# Patient Record
Sex: Female | Born: 1940 | Race: Black or African American | Hispanic: No | Marital: Single | Smoking: Former smoker
Health system: Southern US, Community
[De-identification: ages and names within clinical notes are randomized; demographics above are authoritative.]

## PROBLEM LIST (undated history)

## (undated) DIAGNOSIS — J189 Pneumonia, unspecified organism: Secondary | ICD-10-CM

## (undated) DIAGNOSIS — K259 Gastric ulcer, unspecified as acute or chronic, without hemorrhage or perforation: Secondary | ICD-10-CM

## (undated) DIAGNOSIS — C439 Malignant melanoma of skin, unspecified: Secondary | ICD-10-CM

## (undated) DIAGNOSIS — M5136 Other intervertebral disc degeneration, lumbar region: Secondary | ICD-10-CM

## (undated) DIAGNOSIS — E785 Hyperlipidemia, unspecified: Secondary | ICD-10-CM

## (undated) DIAGNOSIS — M1990 Unspecified osteoarthritis, unspecified site: Secondary | ICD-10-CM

## (undated) DIAGNOSIS — K219 Gastro-esophageal reflux disease without esophagitis: Secondary | ICD-10-CM

## (undated) DIAGNOSIS — M51369 Other intervertebral disc degeneration, lumbar region without mention of lumbar back pain or lower extremity pain: Secondary | ICD-10-CM

## (undated) HISTORY — PX: ABDOMINAL HYSTERECTOMY: SHX81

## (undated) HISTORY — PX: CHOLECYSTECTOMY: SHX55

## (undated) HISTORY — PX: TONSILLECTOMY: SUR1361

---

## 2009-09-03 LAB — CONVERTED CEMR LAB: Pap Smear: NORMAL

## 2009-10-25 ENCOUNTER — Ambulatory Visit (HOSPITAL_BASED_OUTPATIENT_CLINIC_OR_DEPARTMENT_OTHER): Admission: RE | Admit: 2009-10-25 | Discharge: 2009-10-25 | Payer: Self-pay | Admitting: Internal Medicine

## 2009-10-25 ENCOUNTER — Ambulatory Visit: Payer: Self-pay | Admitting: Family Medicine

## 2009-10-25 ENCOUNTER — Ambulatory Visit (HOSPITAL_BASED_OUTPATIENT_CLINIC_OR_DEPARTMENT_OTHER): Admission: RE | Admit: 2009-10-25 | Discharge: 2009-10-25 | Payer: Self-pay | Admitting: Family Medicine

## 2009-10-25 ENCOUNTER — Ambulatory Visit: Payer: Self-pay | Admitting: Internal Medicine

## 2009-10-25 ENCOUNTER — Ambulatory Visit: Payer: Self-pay | Admitting: Interventional Radiology

## 2009-10-25 DIAGNOSIS — K219 Gastro-esophageal reflux disease without esophagitis: Secondary | ICD-10-CM

## 2009-10-25 DIAGNOSIS — Z860100 Personal history of colon polyps, unspecified: Secondary | ICD-10-CM | POA: Insufficient documentation

## 2009-10-25 DIAGNOSIS — R1319 Other dysphagia: Secondary | ICD-10-CM

## 2009-10-25 DIAGNOSIS — E785 Hyperlipidemia, unspecified: Secondary | ICD-10-CM

## 2009-10-25 DIAGNOSIS — M1990 Unspecified osteoarthritis, unspecified site: Secondary | ICD-10-CM

## 2009-10-25 DIAGNOSIS — J189 Pneumonia, unspecified organism: Secondary | ICD-10-CM

## 2009-10-25 DIAGNOSIS — M25569 Pain in unspecified knee: Secondary | ICD-10-CM

## 2009-10-25 DIAGNOSIS — Z8541 Personal history of malignant neoplasm of cervix uteri: Secondary | ICD-10-CM

## 2009-10-26 ENCOUNTER — Encounter: Payer: Self-pay | Admitting: Family Medicine

## 2009-10-26 ENCOUNTER — Telehealth: Payer: Self-pay | Admitting: Internal Medicine

## 2009-11-01 ENCOUNTER — Encounter: Payer: Self-pay | Admitting: Internal Medicine

## 2009-11-01 ENCOUNTER — Ambulatory Visit: Payer: Self-pay | Admitting: Internal Medicine

## 2009-11-01 LAB — CONVERTED CEMR LAB
ALT: 20 units/L (ref 0–35)
AST: 19 units/L (ref 0–37)
Basophils Relative: 0.8 % (ref 0.0–3.0)
Bilirubin, Direct: 0.1 mg/dL (ref 0.0–0.3)
CO2: 29 meq/L (ref 19–32)
Chloride: 101 meq/L (ref 96–112)
Eosinophils Relative: 2.8 % (ref 0.0–5.0)
HCT: 43 % (ref 36.0–46.0)
Hemoglobin: 14.9 g/dL (ref 12.0–15.0)
Lymphs Abs: 1.4 10*3/uL (ref 0.7–4.0)
Monocytes Relative: 7.4 % (ref 3.0–12.0)
Neutro Abs: 3.5 10*3/uL (ref 1.4–7.7)
Platelets: 192 10*3/uL (ref 150.0–400.0)
Potassium: 4.9 meq/L (ref 3.5–5.1)
RBC: 4.64 M/uL (ref 3.87–5.11)
TSH: 1 microintl units/mL (ref 0.35–5.50)
Total Bilirubin: 0.7 mg/dL (ref 0.3–1.2)
Total CHOL/HDL Ratio: 3
WBC: 5.4 10*3/uL (ref 4.5–10.5)

## 2009-11-15 ENCOUNTER — Encounter: Payer: Self-pay | Admitting: Internal Medicine

## 2009-12-20 ENCOUNTER — Ambulatory Visit: Payer: Self-pay | Admitting: Internal Medicine

## 2009-12-20 DIAGNOSIS — I10 Essential (primary) hypertension: Secondary | ICD-10-CM | POA: Insufficient documentation

## 2010-02-19 NOTE — Letter (Signed)
   [place] [facility] [address] [place], [place]  [address]  [place] Phone: [phone]      November 01, 2009   Artesia Batzel [address] [place], [place] [address]  RE:  LAB RESULTS  Dear  Ms. Batton,  The following is an interpretation of your most recent lab tests.  Please take note of any instructions provided or changes to medications that have resulted from your lab work.  ELECTROLYTES:  Good - no changes needed  KIDNEY FUNCTION TESTS:  Good - no changes needed  LIVER FUNCTION TESTS:  Good - no changes needed  LIPID PANEL:  Fair - review at your next visit Triglyceride: 116.0   Cholesterol: 230   HDL: 80.30   Chol/HDL%:  3  THYROID STUDIES:  Thyroid studies normal TSH: 1.00     CBC:  Good - no changes needed  CRP - normal       Sincerely Yours,    Dr. Thomos Lemons  Appended Document:  mailed

## 2010-02-19 NOTE — Assessment & Plan Note (Signed)
Summary: new pt est care/dt [employer]   Vital Signs:  Patient profile:   70 year old female Menstrual status:  hysterectomy Height:      61.75 inches Weight:      147 pounds BMI:     27.20 O2 Sat:      97 % on Room air Temp:     98.2 degrees F oral Pulse rate:   76 / minute Pulse rhythm:   regular Resp:     16 per minute BP sitting:   130 / 80  (right arm) Cuff size:   regular  Vitals Entered By: Glendell Docker CMA (October 25, 2009 2:33 PM)  O2 Flow:  Room air CC: New Patient  Is Patient Diabetic? No Pain Assessment Patient in pain? no      Comments Establish care-previous pcp Dr Juliette Mangle longer accepts insurance     Menstrual Status hysterectomy Last PAP Result normal   Contraindications/Deferment of Procedures/Staging:    Test/Procedure: FLU VAX    Reason for deferment: patient declined   Primary Care Provider:  Dondra Spry DO  CC:  New Patient .  History of Present Illness: 70 y/o female to establish currently undergoing evaluation for left knee pain onset last saturday pt was taking OTC pain medications - aleve taken aleve for 1-2 yrs  x ray of knee reports normal  she also takes aleve for chronic back pain uses 3-4 x per day  (2 aleve) no stomach pain.    several yrs since CPX  Preventive Screening-Counseling & Management  Alcohol-Tobacco     Alcohol drinks/day: <1     Smoking Status: quit     Packs/Day: 1.0     Year Started: 1960's     Year Quit: 2000  Caffeine-Diet-Exercise     Caffeine use/day: 2 beverages daily     Does Patient Exercise: no  Allergies (verified): 1)  ! Sulfa  Past History:  Past Medical History: Cervical cancer, hx of Colonic polyps, hx of GERD  - hx of PUD Mild hyperlipidemia - tx'ed but stopped on her own vision loss in right eye  Past Surgical History: Cholecystectomy 1970 Partial Hysterectomy 1980 Tonsillectomy 1969  Family History: mother  - pancreatic ca Family History of CAD Female 1st  degree relative <50 Family History Hypertension father - hemophilia aunt - breast ca     Social History: Occupation: Materials engineer) Single - married x 3 1 daughter 49, 8 1 son 53 Former smoker born in [place],  grew up in [place] mother lives nearby daughter and son live nearby Alcohol use-yes  (social) Smoking Status:  quit Packs/Day:  1.0 Caffeine use/day:  2 beverages daily Does Patient Exercise:  no  Review of Systems  The patient denies anorexia, weight loss, weight gain, chest pain, syncope, melena, hematochezia, and depression.         intermittent trouble swallowing - solids  Physical Exam  General:  alert, well-developed, and well-nourished.   Head:  normocephalic and atraumatic.   Eyes:  pupils equal, pupils round, and pupils reactive to light.  bilateral arcus senilis Ears:  R ear normal and L ear normal.   Mouth:  pharynx pink and moist.   Neck:  No deformities, masses, or tenderness noted.no carotid bruits.   Lungs:  normal respiratory effort, normal breath sounds, no crackles, and no wheezes.   Heart:  normal rate, regular rhythm, no murmur, and no gallop.   Abdomen:  abd obesity,  NT, no masses, no hepatomegaly,  and no splenomegaly.   Pulses:  dorsalis pedis and posterior tibial pulses are full and equal bilaterally Extremities:  No lower extremity edema Neurologic:  cranial nerves II-XII intact and gait normal.   Psych:  normally interactive, good eye contact, not anxious appearing, and not depressed appearing.     Impression & Recommendations:  Problem # 1:  OTHER DYSPHAGIA (ICD-787.29) intermittent dysphagia to solids.  rule out stricture or mass.  refer to GI Orders: Gastroenterology Referral (GI) TLB-BMP (Basic Metabolic Panel-BMET) (80048-METABOL) TLB-Hepatic/Liver Function Pnl (80076-HEPATIC) TLB-Lipid Panel (80061-LIPID) TLB-CBC Platelet - w/Differential (85025-CBCD)  Problem # 2:  DEGENERATIVE JOINT DISEASE, BACK (ICD-715.98) she  reports hx of chronic low back pain.  take aleve regularly monitor kidney function  Problem # 3:  PNEUMONIA (ICD-486) Hx of pna in June, 2011.  arrange f/u x ray to ensure resolution  Orders: T-2 View CXR, Same Day (71020.5TC)  Other Orders: Radiology Referral (Radiology) Pneumococcal Vaccine (84132) Admin 1st Vaccine (44010) TLB-TSH (Thyroid Stimulating Hormone) (84443-TSH) TLB-CRP-High Sensitivity (C-Reactive Protein) (86140-FCRP)  Patient Instructions: 1)  Please schedule a follow-up appointment in 2 months.   Preventive Care Screening  Last Flu Shot:    Date:  10/25/2009    Results:  Declined  Mammogram:    Date:  09/03/2009    Results:  normal   Pap Smear:    Date:  09/03/2009    Results:  normal   Colonoscopy:    Date:  10/31/2008    Results:  normal     Current Allergies (reviewed today): ! SULFA   Immunizations Administered:  Pneumonia Vaccine:    Vaccine Type: Pneumovax    Site: left deltoid    Mfr: Merck    Dose: 0.5 ml    Route: IM    Given by: Glendell Docker CMA    Exp. Date: 04/04/2011    Lot #: [id number]    VIS given: 12/25/08 version given October 25, 2009.

## 2010-02-19 NOTE — Op Note (Signed)
Summary: Office Procedure Consent  Office Procedure Consent   Imported By: Darius Bump 12/06/2009 12:16:42  _____________________________________________________________________  External Attachment:    Type:   Image     Comment:   External Document

## 2010-02-19 NOTE — Letter (Signed)
   [place] at [facility] [address] [place], [place]  [address]  [place] Phone: [phone]      November 15, 2009   Michaela Jefferson [address] [place], [place] [address]  RE:  LAB RESULTS  Dear  Ms. Shenker,  The following is an interpretation of your most recent lab tests.  Please take note of any instructions provided or changes to medications that have resulted from your lab work.     Bone Density - normal       Sincerely Yours,    Dr. Thomos Lemons  Appended Document:  Mailed.

## 2010-02-19 NOTE — Miscellaneous (Signed)
Summary: BONE DENSITY  Clinical Lists Changes  Orders: Added new Test order of T-Bone Densitometry (77080) - Signed Added new Test order of T-Lumbar Vertebral Assessment (77082) - Signed 

## 2010-02-19 NOTE — Progress Notes (Signed)
Summary: CXR results  Phone Note Outgoing Call   Summary of Call: call pt - CXR is normal Initial call taken by: D. Thomos Lemons DO,  October 26, 2009 4:32 PM  Follow-up for Phone Call        call place to patient at [phone], no answer. A detailed voice message was left informing patient per Dr Artist Pais instructions Follow-up by: Glendell Docker CMA,  October 29, 2009 9:02 AM

## 2010-02-19 NOTE — Assessment & Plan Note (Signed)
Summary: LEFT KNEE PAIN/NP/LP   Vital Signs:  Patient profile:   70 year old female Height:      62 inches Weight:      146.6 pounds BMI:     26.91 Pulse rate:   82 / minute BP sitting:   171 / 109  History of Present Illness: 70 yo F here for left knee pain.  Patient denies any known injury. States pain started over past week more on lateral and posterior left knee. Worse after prolonged sitting. No catching, locking, giving out. No swelling, bruising, redness. No numbness or tingling or new back pain. Noticed left foot is slightly swollen compared to right. Recently started back at [facility] pushing carts and lifting items - thinks she may have aggravated knee doing this as pain was first noticed in morning after a shift. Has tried wrapping knee and using a heating pad but no medicines. No prior knee surgery, injections, radiographs.  Problems Prior to Update: 1)  Preventive Health Care  (ICD-V70.0) 2)  Hyperlipidemia  (ICD-272.4) 3)  Pneumonia  (ICD-486) 4)  Degenerative Joint Disease, Back  (ICD-715.98) 5)  Other Dysphagia  (ICD-787.29) 6)  Family History of Cad Female 1st Degree Relative <50  (ICD-V17.3) 7)  Gerd  (ICD-530.81) 8)  Colonic Polyps, Hx of  (ICD-V12.72) 9)  Cervical Cancer, Hx of  (ICD-V10.41)  Medications Prior to Update: 1)  None  Allergies: 1)  ! Sulfa  Family History: negative for DM heart disease in grandmother HTN in brother  Social History: prior tobacco user, quit 12 years ago social drinker Engineer, maintenance at [employer]  Physical Exam  General:  Well-developed,well-nourished,in no acute distress; alert,appropriate and cooperative throughout examination Msk:  L knee: No gross deformity, swelling, or bruising. 2+ crepitation. TTP lateral joint line with minimal medial joint line TTP. No palpable bakers cyst ROM lacks 10 degrees flexion and pain with full flexion.  Has full extension. Neg ant/post drawers, lachmanns Mcmurrays positive  laterally. Negative patellar grind and apprehension. NVI distally Mild lateral distal hamstring and calf TTP but no cords, negative homans, no swelling or erythema.   Impression & Recommendations:  Problem # 1:  KNEE PAIN, LEFT (ICD-719.46) Assessment New Patient's history and exam consistent with a degenerative meniscal tear of her left knee.  X-rays reviewed (standing) and joint spaces are well maintained.  Given intraarticular cortisone injection today along with knee sleeve which felt comfortable to her and very supportive.  Icing, aleve.  Basic quad strengthening exercises. F/u in 6 weeks for recheck.  After informed written consent patient was seated on the exam table and left knee was prepped with alcohol swab.  Utilizing an anterolateral approach left knee was injected with 1:3 depomedrol:marcaine.  Patient tolerated the procedure well without any immediate complications.    Orders: Joint Aspirate / Injection, Large (20610) Garment,belt,sleeve or other covering ,elastic or similar stretch (B1478)  Patient Instructions: 1)  Your exam is consistent with a degenerative meniscal (cartilage) tear in your left knee. 2)  Usually we can treat this conservatively. 3)  You were given a shot in your knee to calm the swelling down. 4)  Wear the knee sleeve for support when you are going to be on your feet and walking a lot 5)  It's ok to continue to take aleve twice a day with food. 6)  Ice your knee up to 3-4 times a day for 15 minutes at a time. 7)  Do simple leg raise exercises 3 x 10 most days  of the week to maintain your quad strength. 8)  Follow up with me in 6 weeks or as needed. 9)  If you have any increased redness, soreness, fever, severe pain, call me or seek care.

## 2010-02-21 NOTE — Assessment & Plan Note (Signed)
Summary: 2 month foloow up/mhf   Vital Signs:  Patient profile:   70 year old female Menstrual status:  hysterectomy Height:      61.5 inches Weight:      148.50 pounds BMI:     27.70 O2 Sat:      99 % on Room air Temp:     98.2 degrees F oral Pulse rate:   66 / minute Resp:     16 per minute BP sitting:   140 / 100  (right arm) Cuff size:   regular  Vitals Entered By: Glendell Docker CMA (December 20, 2009 8:35 AM)  O2 Flow:  Room air CC: 2 Month Follow up Is Patient Diabetic? No Pain Assessment Patient in pain? no        Primary Care Kia Stavros:  Dondra Spry DO  CC:  2 Month Follow up.  History of Present Illness: 70 y/o female for fu pt has had high normal BP readings in the past prev labs reviewed blood sugar is slightly elevated,  no fam hx of DM II    Preventive Screening-Counseling & Management  Alcohol-Tobacco     Smoking Status: quit  Allergies: 1)  ! 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   Lungs:  normal respiratory effort, normal breath sounds, no crackles, and no wheezes.   Heart:  normal rate, regular rhythm, no murmur, and no gallop.     Impression & Recommendations:  Problem # 1:  HYPERTENSION (ICD-401.9) start antihypertensive I  encouraged low salt diet  Her updated medication list for this problem includes:    Hydrochlorothiazide 12.5 Mg Tabs (Hydrochlorothiazide) ..... One by mouth once daily  BP today: 140/100 Prior BP: 130/80 (10/25/2009)  Labs Reviewed: K+: 4.9 (11/01/2009) Creat: : 1.0 (11/01/2009)   Chol: 230 (11/01/2009)   HDL: 80.30 (11/01/2009)   TG: 116.0 (11/01/2009)  Complete Medication List: 1)  Omeprazole 20 Mg Cpdr (Omeprazole) .... Take one capsule by mouth every other day 2)  Flax Seed Oil 1000 Mg Caps (Flaxseed (linseed)) .... Take 1 capsule by mouth once a day 3)  Hydrochlorothiazide 12.5 Mg Tabs (Hydrochlorothiazide) .... One by mouth once daily  Patient Instructions: 1)  Please schedule a follow-up appointment in 2 months. 2)  BMP prior to visit, ICD-9: 401.9 3)  HbgA1C prior to visit, ICD-9:  790.29 4)  Increase your intake high K foods 5)  Avoid sweets and decrease your intake carbohydrates Prescriptions: HYDROCHLOROTHIAZIDE 12.5 MG TABS (HYDROCHLOROTHIAZIDE) one by mouth once daily  #30 x 3   Entered and Authorized by:   D. Thomos Lemons DO   Signed by:   D. Thomos Lemons DO on 12/20/2009   Method used:   Electronically to        [facility] [phone]* (retail)       [address] [place], [place]  [address]       Ph: [phone]       Fax: [phone]   RxID:   [id number]    Orders Added: 1)  Est. Patient Level III [32440]    Current Allergies (reviewed today): ! SULFA

## 2010-03-14 ENCOUNTER — Ambulatory Visit: Payer: Self-pay | Admitting: Internal Medicine

## 2010-12-31 ENCOUNTER — Encounter: Payer: Self-pay | Admitting: Internal Medicine

## 2010-12-31 ENCOUNTER — Emergency Department (INDEPENDENT_AMBULATORY_CARE_PROVIDER_SITE_OTHER): Payer: Managed Care, Other (non HMO)

## 2010-12-31 ENCOUNTER — Encounter (HOSPITAL_BASED_OUTPATIENT_CLINIC_OR_DEPARTMENT_OTHER): Payer: Self-pay

## 2010-12-31 ENCOUNTER — Ambulatory Visit (INDEPENDENT_AMBULATORY_CARE_PROVIDER_SITE_OTHER): Payer: Managed Care, Other (non HMO) | Admitting: Internal Medicine

## 2010-12-31 ENCOUNTER — Emergency Department (HOSPITAL_BASED_OUTPATIENT_CLINIC_OR_DEPARTMENT_OTHER)
Admission: EM | Admit: 2010-12-31 | Discharge: 2010-12-31 | Disposition: A | Discharge status: Home / Self Care | Payer: Managed Care, Other (non HMO) | Attending: Emergency Medicine | Admitting: Emergency Medicine

## 2010-12-31 VITALS — BP 142/80 | HR 100 | Temp 101.9°F | Resp 24

## 2010-12-31 DIAGNOSIS — J189 Pneumonia, unspecified organism: Secondary | ICD-10-CM | POA: Insufficient documentation

## 2010-12-31 DIAGNOSIS — R0989 Other specified symptoms and signs involving the circulatory and respiratory systems: Secondary | ICD-10-CM

## 2010-12-31 DIAGNOSIS — R52 Pain, unspecified: Secondary | ICD-10-CM

## 2010-12-31 DIAGNOSIS — R05 Cough: Secondary | ICD-10-CM

## 2010-12-31 DIAGNOSIS — R509 Fever, unspecified: Secondary | ICD-10-CM

## 2010-12-31 DIAGNOSIS — R197 Diarrhea, unspecified: Secondary | ICD-10-CM | POA: Insufficient documentation

## 2010-12-31 DIAGNOSIS — R112 Nausea with vomiting, unspecified: Secondary | ICD-10-CM | POA: Insufficient documentation

## 2010-12-31 LAB — COMPREHENSIVE METABOLIC PANEL
ALT: 14 U/L (ref 0–35)
AST: 19 U/L (ref 0–37)
CO2: 25 mEq/L (ref 19–32)
Chloride: 98 mEq/L (ref 96–112)
Creatinine, Ser: 0.8 mg/dL (ref 0.50–1.10)
GFR calc non Af Amer: 74 mL/min — ABNORMAL LOW (ref >90)
Total Bilirubin: 0.4 mg/dL (ref 0.3–1.2)

## 2010-12-31 LAB — CBC
MCV: 88.6 fL (ref 78.0–100.0)
Platelets: 159 10*3/uL (ref 150–400)
RBC: 5.01 MIL/uL (ref 3.87–5.11)
WBC: 10.5 10*3/uL (ref 4.0–10.5)

## 2010-12-31 LAB — URINALYSIS, ROUTINE W REFLEX MICROSCOPIC
Bilirubin Urine: NEGATIVE
Hgb urine dipstick: NEGATIVE
Ketones, ur: >80 mg/dL — AB
Protein, ur: NEGATIVE mg/dL
Urobilinogen, UA: 0.2 mg/dL (ref 0.0–1.0)

## 2010-12-31 MED ORDER — SODIUM CHLORIDE 0.9 % IV BOLUS (SEPSIS)
1000.0000 mL | Freq: Once | INTRAVENOUS | Status: AC
  Administered 2010-12-31: 1000 mL via INTRAVENOUS

## 2010-12-31 MED ORDER — ACETAMINOPHEN 500 MG PO TABS
1000.0000 mg | ORAL_TABLET | Freq: Once | ORAL | Status: AC
  Administered 2010-12-31: 1000 mg via ORAL
  Filled 2010-12-31: qty 2

## 2010-12-31 MED ORDER — AZITHROMYCIN 250 MG PO TABS: 250.0000 mg | ORAL_TABLET | Freq: Every day | ORAL | Status: AC

## 2010-12-31 MED ORDER — DEXTROSE 5 % IV SOLN
1.0000 g | Freq: Once | INTRAVENOUS | Status: AC
  Administered 2010-12-31: 1 g via INTRAVENOUS
  Filled 2010-12-31: qty 10

## 2010-12-31 MED ORDER — AZITHROMYCIN 250 MG PO TABS
500.0000 mg | ORAL_TABLET | Freq: Once | ORAL | Status: AC
  Administered 2010-12-31: 500 mg via ORAL
  Filled 2010-12-31: qty 2

## 2010-12-31 NOTE — ED Provider Notes (Addendum)
History     CSN: [id number] Arrival date & time: 12/31/2010  8:37 AM   First MD Initiated Contact with Patient 12/31/10 [phone]      Chief Complaint  Patient presents with  . Nausea  . Emesis  . Diarrhea  . Cough  . Sore Throat    (Consider location/radiation/quality/duration/timing/severity/associated sxs/prior treatment) HPI Complains of sore throat, cough, nonproductive onset 4 days ago. Seen at [facility] 3 days ago amoxicillin prescribed patient flt better until this morning when she developed vomiting and diarrhea 5 episodes of vomiting and 5 episodes of diarrhea accompanied by diffuse body aches. No other treatment prior to coming here nothing makes symptoms better or worse. Symptoms nonradiating moderate, no other associated symptoms History reviewed. No pertinent past medical history. Past medical history pneumonia. Past Surgical History  Procedure Date  . Abdominal hysterectomy   . Cholecystectomy     No family history on file.  History  Substance Use Topics  . Smoking status: Former Games developer  . Smokeless tobacco: Never Used  . Alcohol Use: Yes     occasional    OB History    Grav Para Term Preterm Abortions TAB SAB Ect Mult Living                  Review of Systems  Constitutional: Positive for fever.  HENT:       Sore throat  Respiratory: Positive for cough.   Cardiovascular: Negative.   Gastrointestinal: Positive for vomiting and diarrhea.  Musculoskeletal: Negative.   Skin: Negative.   Neurological: Negative.   Hematological: Negative.   Psychiatric/Behavioral: Negative.     Allergies  Sulfonamide derivatives  Home Medications   Current Outpatient Rx  Name Route Sig Dispense Refill  . AMOXICILLIN 500 MG PO CAPS Oral Take 500 mg by mouth 3 (three) times daily.      Marland Kitchen VITAMIN C 100 MG PO TABS Oral Take by mouth daily.      . OMEGA-3 FATTY ACIDS 1000 MG PO CAPS Oral Take by mouth daily.      Marland Kitchen GLUCOSAMINE-CHONDROITIN 500-400 MG PO  TABS Oral Take 1 tablet by mouth daily.      Marland Kitchen VITAMIN B-6 100 MG PO TABS Oral Take by mouth daily.      Marland Kitchen VITAMIN B-12 100 MCG PO TABS Oral Take by mouth daily.      Marland Kitchen VITAMIN D (ERGOCALCIFEROL) 50000 UNITS PO CAPS Oral Take by mouth daily.      Marland Kitchen VITAMIN E 100 UNITS PO CAPS Oral Take by mouth daily.        BP 141/104  Pulse 94  Temp(Src) 100.5 F (38.1 C) (Oral)  Resp 16  Ht 5\' 1"  (1.549 m)  Wt 148 lb (67.132 kg)  BMI 27.96 kg/m2  SpO2 94%  Physical Exam  Nursing note and vitals reviewed. Constitutional: She appears well-developed and well-nourished.  HENT:  Head: Normocephalic and atraumatic.  Left Ear: External ear normal.  Nose: Nose normal.  Mouth/Throat: Oropharynx is clear and moist. No oropharyngeal exudate.       Bilateral tympanic membranes normal  Eyes: Conjunctivae are normal. Pupils are equal, round, and reactive to light.  Neck: Neck supple. No tracheal deviation present. No thyromegaly present.  Cardiovascular: Normal rate and regular rhythm.   No murmur heard. Pulmonary/Chest: Effort normal and breath sounds normal.  Abdominal: Soft. Bowel sounds are normal. She exhibits no distension. There is no tenderness.  Musculoskeletal: Normal range of motion. She exhibits no edema  and no tenderness.  Lymphadenopathy:    She has no cervical adenopathy.  Neurological: She is alert. Coordination normal.  Skin: Skin is warm and dry. No rash noted.  Psychiatric: She has a normal mood and affect.    ED Course  Procedures (including critical care time) 11 AM patient feels improved after intravenous fluids Labs Reviewed - No data to display No results found.   No diagnosis found.   Results for orders placed during the hospital encounter of 12/31/10  CBC      Component Value Range   WBC 10.5  4.0 - 10.5 (K/uL)   RBC 5.01  3.87 - 5.11 (MIL/uL)   Hemoglobin 15.3 (*) 12.0 - 15.0 (g/dL)   HCT 40.9  81.1 - 91.4 (%)   MCV 88.6  78.0 - 100.0 (fL)   MCH 30.5  26.0 -  34.0 (pg)   MCHC 34.5  30.0 - 36.0 (g/dL)   RDW 78.2  95.6 - 21.3 (%)   Platelets 159  150 - 400 (K/uL)  COMPREHENSIVE METABOLIC PANEL      Component Value Range   Sodium 136  135 - 145 (mEq/L)   Potassium 3.9  3.5 - 5.1 (mEq/L)   Chloride 98  96 - 112 (mEq/L)   CO2 25  19 - 32 (mEq/L)   Glucose, Bld 164 (*) 70 - 99 (mg/dL)   BUN 13  6 - 23 (mg/dL)   Creatinine, Ser 0.86  0.50 - 1.10 (mg/dL)   Calcium 9.6  8.4 - 57.8 (mg/dL)   Total Protein 7.9  6.0 - 8.3 (g/dL)   Albumin 4.4  3.5 - 5.2 (g/dL)   AST 19  0 - 37 (U/L)   ALT 14  0 - 35 (U/L)   Alkaline Phosphatase 92  39 - 117 (U/L)   Total Bilirubin 0.4  0.3 - 1.2 (mg/dL)   GFR calc non Af Amer 74 (*) >90 (mL/min)   GFR calc Af Amer 86 (*) >90 (mL/min)  URINALYSIS, ROUTINE W REFLEX MICROSCOPIC      Component Value Range   Color, Urine YELLOW  YELLOW    APPearance CLEAR  CLEAR    Specific Gravity, Urine 1.023  1.005 - 1.030    pH 6.0  5.0 - 8.0    Glucose, UA NEGATIVE  NEGATIVE (mg/dL)   Hgb urine dipstick NEGATIVE  NEGATIVE    Bilirubin Urine NEGATIVE  NEGATIVE    Ketones, ur >80 (*) NEGATIVE (mg/dL)   Protein, ur NEGATIVE  NEGATIVE (mg/dL)   Urobilinogen, UA 0.2  0.0 - 1.0 (mg/dL)   Nitrite NEGATIVE  NEGATIVE    Leukocytes, UA MODERATE (*) NEGATIVE   URINE MICROSCOPIC-ADD ON      Component Value Range   Squamous Epithelial / LPF RARE  RARE    WBC, UA 7-10  <3 (WBC/hpf)   RBC / HPF 0-2  <3 (RBC/hpf)   Bacteria, UA MANY (*) RARE    Dg Chest 2 View  12/31/2010  *RADIOLOGY REPORT*  Clinical Data: Cough, congestion and body aches.  CHEST - 2 VIEW  Comparison: Chest x-ray 10/25/2009.  Findings: The cardiac silhouette, mediastinal and hilar contours are within normal limits and stable.  Mild tortuosity and calcification of the thoracic aorta.  The lungs demonstrate mild underlying chronic bronchitic changes and streaky density at the right lung base.  This is likely atelectasis but cannot exclude a developing infiltrate.  The  left lung bases clear.  No pleural effusions or pneumothorax.  The bony  thorax is intact.  IMPRESSION: Mild chronic bronchitic type lung changes with right lower lobe atelectasis versus developing infiltrate.  Original Report Authenticated By: P. Loralie Champagne, M.D.    MDM  PORT score 58, outpatient therapy reasonable Plan prescription Zithromax Encourage by mouth fluids Followup Dr.YOO D/c amoxil Diagnosis #1 community acquired pneumonia #2 hyperglycemia        Doug Sou, MD 12/31/10 1106  Doug Sou, MD 12/31/10 1137

## 2010-12-31 NOTE — Progress Notes (Signed)
  Subjective:    Patient ID: Michaela Jefferson, female    DOB: 1940/08/06, 70 y.o.   MRN: [id number]  HPI Pt presents to clinic for evaluation of fever, N/V and diarrhea. Notes 4 day h/o fever, chills, cough, N/V/D and generalized weakeness. +dizziness without syncope. Seen at UC several days ago and placed on amoxicillin but feels no better. Recalls neg flu swab at [facility]. So weak has difficulty standing or walking. No syncope. No other alleviating or exacerbating factors.  No past medical history on file. No past surgical history on file.  does not have a smoking history on file. She does not have any smokeless tobacco history on file. Her alcohol and drug histories not on file. family history is not on file. Allergies  Allergen Reactions  . Sulfonamide Derivatives     REACTION: reaction unknown     Review of Systems see hpi     Objective:   Physical Exam  Nursing note and vitals reviewed. Constitutional: She appears well-developed. She has a sickly appearance. She appears distressed.  HENT:  Head: Normocephalic and atraumatic.  Right Ear: External ear normal.  Left Ear: External ear normal.  Mouth/Throat: Oropharynx is clear and moist. No oropharyngeal exudate.  Eyes: Conjunctivae are normal. Right eye exhibits no discharge. Left eye exhibits no discharge. No scleral icterus.  Neck: Neck supple.  Cardiovascular: Regular rhythm.  Tachycardia present.  Exam reveals no gallop and no friction rub.   No murmur heard. Pulmonary/Chest: Effort normal and breath sounds normal. No respiratory distress. She has no wheezes. She has no rales.  Lymphadenopathy:    She has no cervical adenopathy.  Neurological: She is alert.  Skin: Skin is warm. She is diaphoretic.  Psychiatric: She has a normal mood and affect.          Assessment & Plan:

## 2010-12-31 NOTE — ED Notes (Signed)
Pt returned from radiology.

## 2010-12-31 NOTE — ED Notes (Signed)
MD at bedside. 

## 2010-12-31 NOTE — ED Notes (Signed)
Pt reports nausea, vomiting, diarrhea and cold symptoms that started Saturday. She was seen by PMD Sunday, given PO and IM ABX but not improving.

## 2011-01-01 ENCOUNTER — Encounter: Payer: Self-pay | Admitting: Internal Medicine

## 2011-01-01 DIAGNOSIS — R509 Fever, unspecified: Secondary | ICD-10-CM | POA: Insufficient documentation

## 2011-01-01 NOTE — Assessment & Plan Note (Signed)
Concern for volume depletion accompanying infection. Recommend transfer to ED for further evaluation and consideration of IVF.

## 2011-01-08 ENCOUNTER — Encounter: Payer: Self-pay | Admitting: Internal Medicine

## 2011-01-08 ENCOUNTER — Ambulatory Visit (INDEPENDENT_AMBULATORY_CARE_PROVIDER_SITE_OTHER): Payer: Managed Care, Other (non HMO) | Admitting: Internal Medicine

## 2011-01-08 VITALS — BP 120/70 | HR 68 | Temp 98.2°F | Resp 18

## 2011-01-08 DIAGNOSIS — J189 Pneumonia, unspecified organism: Secondary | ICD-10-CM

## 2011-01-08 MED ORDER — MOXIFLOXACIN HCL 400 MG PO TABS: 400.0000 mg | ORAL_TABLET | Freq: Every day | ORAL | Status: AC

## 2011-01-12 DIAGNOSIS — J189 Pneumonia, unspecified organism: Secondary | ICD-10-CM | POA: Insufficient documentation

## 2011-01-12 NOTE — Progress Notes (Signed)
  Subjective:    Patient ID: Michaela Jefferson, female    DOB: 02-Jan-1941, 70 y.o.   MRN: [id number]  HPI Pt presents to clinic for f/u of possible pneumonia. Seen in ED with CXR suggestive of possible infiltrate. Given zpak and feels better. Has nasal drainage with cough productive for yellow sputum without hemoptysis. Has associated fatigue. Denies f/c. Had pneumovax 2011. No alleviating or exacerbating factors.   No past medical history on file. Past Surgical History  Procedure Date  . Abdominal hysterectomy   . Cholecystectomy     reports that she has quit smoking. She has never used smokeless tobacco. She reports that she drinks alcohol. She reports that she does not use illicit drugs. family history is not on file. Allergies  Allergen Reactions  . Sulfonamide Derivatives Anaphylaxis    Has not taken since childhood     Review of Systems see hpi     Objective:   Physical Exam  Nursing note and vitals reviewed. Constitutional: She appears well-developed and well-nourished. No distress.  HENT:  Head: Normocephalic and atraumatic.  Left Ear: External ear normal.  Nose: Nose normal.  Mouth/Throat: Oropharynx is clear and moist. No oropharyngeal exudate.  Eyes: Conjunctivae are normal. No scleral icterus.  Neck: Neck supple.  Cardiovascular: Normal rate, regular rhythm and normal heart sounds.   Pulmonary/Chest: Effort normal and breath sounds normal. No respiratory distress. She has no wheezes. She has no rales.  Neurological: She is alert.  Skin: Skin is warm and dry. She is not diaphoretic.  Psychiatric: She has a normal mood and affect.          Assessment & Plan:

## 2011-01-12 NOTE — Assessment & Plan Note (Signed)
Clinically improved but not resolved. Attempt levaquin. Work note provided. Followup if no improvement or worsening.

## 2011-01-21 DIAGNOSIS — J189 Pneumonia, unspecified organism: Secondary | ICD-10-CM

## 2011-01-21 HISTORY — DX: Pneumonia, unspecified organism: J18.9

## 2012-03-26 ENCOUNTER — Ambulatory Visit: Payer: Managed Care, Other (non HMO) | Admitting: Family Medicine

## 2012-04-06 ENCOUNTER — Encounter (HOSPITAL_BASED_OUTPATIENT_CLINIC_OR_DEPARTMENT_OTHER): Payer: Self-pay | Admitting: *Deleted

## 2012-04-06 ENCOUNTER — Emergency Department (HOSPITAL_BASED_OUTPATIENT_CLINIC_OR_DEPARTMENT_OTHER)
Admission: EM | Admit: 2012-04-06 | Discharge: 2012-04-06 | Disposition: A | Discharge status: Home / Self Care | Payer: Managed Care, Other (non HMO) | Attending: Emergency Medicine | Admitting: Emergency Medicine

## 2012-04-06 DIAGNOSIS — Z87891 Personal history of nicotine dependence: Secondary | ICD-10-CM | POA: Insufficient documentation

## 2012-04-06 DIAGNOSIS — Z9889 Other specified postprocedural states: Secondary | ICD-10-CM | POA: Insufficient documentation

## 2012-04-06 DIAGNOSIS — I10 Essential (primary) hypertension: Secondary | ICD-10-CM | POA: Insufficient documentation

## 2012-04-06 DIAGNOSIS — Z79899 Other long term (current) drug therapy: Secondary | ICD-10-CM | POA: Insufficient documentation

## 2012-04-06 NOTE — ED Notes (Signed)
Pt. Eating ice cream with no difficulty.

## 2012-04-06 NOTE — ED Notes (Signed)
Hypertension. Had an endoscopy today and afterward her BP was high.

## 2012-04-06 NOTE — ED Notes (Signed)
Pt. Is able to swallow and has no complaints of pain, headache and no drooling noted.

## 2012-04-06 NOTE — ED Provider Notes (Signed)
History     CSN: [id number]  Arrival date & time 04/06/12  1751   First MD Initiated Contact with Patient 04/06/12 1814      Chief Complaint  Patient presents with  . Hypertension    (Consider location/radiation/quality/duration/timing/severity/associated sxs/prior treatment) The history is provided by the patient.  Michaela Jefferson is a 72 y.o. female history of esophageal strictures status post endoscopy today here presenting with hypertension. Endoscopy had been between 1 PM to 3 PM today. He was an uncomplicated procedure and she said her esophagus was dilated today. Afterwards she was found to be hypertensive 160s over 90s. She went to see her doctor and was sent for evaluation. Denies any chest pain shortness of breath or abdominal pain. She has not eaten since last night. She does not have a history of hypertension. She had normal EKG and UA in the office today.    History reviewed. No pertinent past medical history.  Past Surgical History  Procedure Laterality Date  . Abdominal hysterectomy    . Cholecystectomy      No family history on file.  History  Substance Use Topics  . Smoking status: Former Games developer  . Smokeless tobacco: Never Used  . Alcohol Use: Yes     Comment: occasional    OB History   Grav Para Term Preterm Abortions TAB SAB Ect Mult Living                  Review of Systems  All other systems reviewed and are negative.    Allergies  Sulfonamide derivatives  Home Medications   Current Outpatient Rx  Name  Route  Sig  Dispense  Refill  . Ascorbic Acid (VITAMIN C) 100 MG tablet   Oral   Take 500 mg by mouth daily.          Marland Kitchen BLACK COHOSH EXTRACT PO   Oral   Take 1 tablet by mouth daily.           . fish oil-omega-3 fatty acids 1000 MG capsule   Oral   Take 1 g by mouth daily.          . folic acid (FOLVITE) 1 MG tablet   Oral   Take 1 mg by mouth daily.           . Ginseng (GIN-ZING PO)   Oral   Take 1 tablet by mouth  daily.           Marland Kitchen glucosamine-chondroitin 500-400 MG tablet   Oral   Take 1 tablet by mouth daily.          . Misc Natural Products (RED WINE EXTRACT PLUS PO)   Oral   Take 1 capsule by mouth daily.           Marland Kitchen pyridOXINE (VITAMIN B-6) 100 MG tablet   Oral   Take 50 mg by mouth daily.          . vitamin B-12 (CYANOCOBALAMIN) 100 MCG tablet   Oral   Take 50 mcg by mouth daily.          . vitamin E 100 UNIT capsule   Oral   Take 100 Units by mouth daily.          . vitamin E 400 UNIT capsule   Oral   Take 400 Units by mouth daily.             BP 148/89  Pulse 61  Temp(Src) 98 F (36.7 C) (  Oral)  Resp 16  SpO2 99%  Physical Exam  Nursing note and vitals reviewed. Constitutional: She is oriented to person, place, and time. She appears well-developed and well-nourished.  Slightly anxious   HENT:  Head: Normocephalic.  Mouth/Throat: Oropharynx is clear and moist.  Eyes: Conjunctivae are normal. Pupils are equal, round, and reactive to light.  Neck: Normal range of motion. Neck supple.  Cardiovascular: Normal rate, regular rhythm and normal heart sounds.   Pulmonary/Chest: Effort normal and breath sounds normal. No respiratory distress. She has no wheezes. She has no rales.  Abdominal: Soft. Bowel sounds are normal. She exhibits no distension. There is no tenderness. There is no rebound and no guarding.  Musculoskeletal: Normal range of motion. She exhibits no edema and no tenderness.  Neurological: She is alert and oriented to person, place, and time.  Skin: Skin is warm and dry.  Psychiatric: She has a normal mood and affect. Her behavior is normal. Judgment and thought content normal.    ED Course  Procedures (including critical care time)  Labs Reviewed - No data to display No results found.   No diagnosis found.    MDM  Michaela Jefferson is a 72 y.o. female here with hypertension but asymptomatic. Will wait until patient more calm and  reassess. BP 150s/80s on arrival initially. No signs of hypertensive emergency or urgency, likely side effects of the anesthesia.   7:01 PM She was comfortable. BP dec to 158/89. Asymptomatic. Still likely to be side effect of anesthesia. Will not start on antihypertensives for now. Recommend f/u in a week, if BP still elevated then can started on meds.         Richardean Canal, MD 04/06/12 Mikle Bosworth

## 2013-01-11 ENCOUNTER — Encounter: Payer: Self-pay | Admitting: Family Medicine

## 2013-01-11 ENCOUNTER — Ambulatory Visit (INDEPENDENT_AMBULATORY_CARE_PROVIDER_SITE_OTHER): Payer: Managed Care, Other (non HMO) | Admitting: Family Medicine

## 2013-01-11 ENCOUNTER — Encounter (INDEPENDENT_AMBULATORY_CARE_PROVIDER_SITE_OTHER): Payer: Self-pay

## 2013-01-11 VITALS — BP 147/89 | HR 67 | Ht 62.0 in | Wt 147.0 lb

## 2013-01-11 DIAGNOSIS — M25559 Pain in unspecified hip: Secondary | ICD-10-CM

## 2013-01-11 DIAGNOSIS — M25551 Pain in right hip: Secondary | ICD-10-CM

## 2013-01-11 NOTE — Progress Notes (Signed)
Patient ID: Michaela Jefferson, female   DOB: Jun 16, 1940, 73 y.o.   MRN: [id number]  PCP: Letitia Libra, Ala Dach, MD  Subjective:   HPI: Patient is a 72 y.o. female here for right > left hip pain.  Patient reports over past couple years she has had right worse than left back/hip pain. Told last year she had DDD and was treated with at least one epidural but no relief. Also had two intraarticular injections of right hip - one of these with mild relief. Denies numbness or tingling. No bowel/bladder dysfunction. She did have MRI of lumbar spine showing DDD. Had x-rays of hip, told she had arthritis. Takes glucosamine - has tried tramadol, mobic. Seen chiropractor and pain management doctor.  History reviewed. No pertinent past medical history.  Current Outpatient Prescriptions on File Prior to Visit  Medication Sig Dispense Refill  . Ascorbic Acid (VITAMIN C) 100 MG tablet Take 500 mg by mouth daily.       Marland Kitchen BLACK COHOSH EXTRACT PO Take 1 tablet by mouth daily.        . fish oil-omega-3 fatty acids 1000 MG capsule Take 1 g by mouth daily.       . folic acid (FOLVITE) 1 MG tablet Take 1 mg by mouth daily.        . Ginseng (GIN-ZING PO) Take 1 tablet by mouth daily.        Marland Kitchen glucosamine-chondroitin 500-400 MG tablet Take 1 tablet by mouth daily.       . Misc Natural Products (RED WINE EXTRACT PLUS PO) Take 1 capsule by mouth daily.        Marland Kitchen pyridOXINE (VITAMIN B-6) 100 MG tablet Take 50 mg by mouth daily.       . vitamin B-12 (CYANOCOBALAMIN) 100 MCG tablet Take 50 mcg by mouth daily.       . vitamin E 100 UNIT capsule Take 100 Units by mouth daily.       . vitamin E 400 UNIT capsule Take 400 Units by mouth daily.         No current facility-administered medications on file prior to visit.    Past Surgical History  Procedure Laterality Date  . Abdominal hysterectomy    . Cholecystectomy      Allergies  Allergen Reactions  . Sulfonamide Derivatives Anaphylaxis    Has not taken  since childhood    History   Social History  . Marital Status: Single    Spouse Name: N/A    Number of Children: N/A  . Years of Education: N/A   Occupational History  . Not on file.   Social History Main Topics  . Smoking status: Former Games developer  . Smokeless tobacco: Never Used  . Alcohol Use: Yes     Comment: occasional  . Drug Use: No  . Sexual Activity: Not on file   Other Topics Concern  . Not on file   Social History Narrative  . No narrative on file    History reviewed. No pertinent family history.  BP 147/89  Pulse 67  Ht 5\' 2"  (1.575 m)  Wt 147 lb (66.679 kg)  BMI 26.88 kg/m2  Review of Systems: See HPI above.    Objective:  Physical Exam:  Gen: NAD  Back/bilateral hips: No gross deformity, scoliosis. No paraspinal, midline TTP.  No hip tenderness. FROM of back without pain.  Very limited ROM right hip - less than 5 degrees IR and ER.  Left hip 10 degrees ER,  5 degrees IR.  Right hip with painful ROM, no pain left hip. Strength LEs 5/5 all muscle groups.  Pain with hip flexion. 2+ MSRs in patellar and achilles tendons, equal bilaterally. Negative SLRs. Sensation intact to light touch bilaterally. Positive logroll right hip, negative left. Negative fabers and piriformis stretches.    Assessment & Plan:  1. 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  Severely limited ROM - suspect severe DJD.  Will try another intraarticular hip injeciton on right.  Discussed nsaids, tylenol, glucosamine, capsaicin.  If not improving with injection discussed considering ortho referral to discuss hip replacement.

## 2013-01-11 NOTE — Assessment & Plan Note (Signed)
exam consistent with severe right hip DJD as what is reproducing her pain.  Severely limited ROM - suspect severe DJD.  Will try another intraarticular hip injeciton on right.  Discussed nsaids, tylenol, glucosamine, capsaicin.  If not improving with injection discussed considering ortho referral to discuss hip replacement.

## 2013-01-11 NOTE — Patient Instructions (Signed)
Your pain is due to arthritis of your right hip, likely severe. Take tylenol 500mg  1-2 tabs three times a day for pain. Meloxicam 15mg  daily with food for pain and inflammation. Continue glucosamine. Capsaicin topically up to four times a day may also help with pain. Cobroxin is unlikely to help based on available data. Cortisone injections are an option - we will set you up for one of these. It's important that you continue to stay active. Consider physical therapy to strengthen muscles around the joint that hurts to take pressure off of the joint itself. Shoe inserts with good arch support may be helpful. Heat or ice 15 minutes at a time 3-4 times a day as needed to help with pain. Call me in 2 weeks to let me know how you're doing.

## 2013-01-18 ENCOUNTER — Other Ambulatory Visit: Payer: Self-pay | Admitting: Family Medicine

## 2013-01-18 DIAGNOSIS — M25551 Pain in right hip: Secondary | ICD-10-CM

## 2013-01-21 ENCOUNTER — Ambulatory Visit
Admission: RE | Admit: 2013-01-21 | Discharge: 2013-01-21 | Disposition: A | Discharge status: Home / Self Care | Payer: Managed Care, Other (non HMO) | Source: Ambulatory Visit | Attending: Family Medicine | Admitting: Family Medicine

## 2013-01-21 DIAGNOSIS — M25551 Pain in right hip: Secondary | ICD-10-CM

## 2013-01-21 MED ORDER — IOHEXOL 180 MG/ML  SOLN
1.0000 mL | Freq: Once | INTRAMUSCULAR | Status: AC | PRN
  Administered 2013-01-21: 1 mL via INTRA_ARTICULAR

## 2013-01-21 MED ORDER — METHYLPREDNISOLONE ACETATE 40 MG/ML INJ SUSP (RADIOLOG
120.0000 mg | Freq: Once | INTRAMUSCULAR | Status: AC
  Administered 2013-01-21: 120 mg via INTRA_ARTICULAR

## 2013-02-22 ENCOUNTER — Ambulatory Visit (INDEPENDENT_AMBULATORY_CARE_PROVIDER_SITE_OTHER): Payer: Managed Care, Other (non HMO) | Admitting: Family Medicine

## 2013-02-22 ENCOUNTER — Encounter (INDEPENDENT_AMBULATORY_CARE_PROVIDER_SITE_OTHER): Payer: Self-pay

## 2013-02-22 ENCOUNTER — Encounter: Payer: Self-pay | Admitting: Family Medicine

## 2013-02-22 VITALS — BP 163/95 | HR 63 | Ht 63.0 in | Wt 148.0 lb

## 2013-02-22 DIAGNOSIS — M25551 Pain in right hip: Secondary | ICD-10-CM

## 2013-02-22 DIAGNOSIS — M25552 Pain in left hip: Principal | ICD-10-CM

## 2013-02-22 DIAGNOSIS — M25559 Pain in unspecified hip: Secondary | ICD-10-CM

## 2013-02-22 DIAGNOSIS — M1990 Unspecified osteoarthritis, unspecified site: Secondary | ICD-10-CM

## 2013-02-23 ENCOUNTER — Encounter: Payer: Self-pay | Admitting: Family Medicine

## 2013-02-23 NOTE — Progress Notes (Signed)
Patient ID: Michaela Jefferson, female   DOB: 1940-10-24, 73 y.o.   MRN: [id number]  PCP: Jeb Levering, Philbert Riser, MD  Subjective:   HPI: Patient is a 73 y.o. female here for right > left hip pain.  12/23: Patient reports over past couple years she has had right worse than left back/hip pain. Told last year she had DDD and was treated with at least one epidural but no relief. Also had two intraarticular injections of right hip - one of these with mild relief. Denies numbness or tingling. No bowel/bladder dysfunction. She did have MRI of lumbar spine showing DDD. Had x-rays of hip, told she had arthritis. Takes glucosamine - has tried tramadol, mobic. Seen chiropractor and pain management doctor.  2/3: Patient reports she is significantly better following right intraarticular hip injection. At least 75% better, can move around quicker. Is interested in repeating an injection for her back now. Has had an ESI she believes right side of low back following an MRI - several months of relief. Has pain exclusively on right side of back worse with bending, extending. No numbness/tingling. No bowel/bladder dysfunction. No radiation into legs. MRI dated 12/09/2011 showed disc bulge at L3-4 to left side with possible left L3, L4 nerve compression.  Grade 1 anterolisthesis at L4-5.  Severe facet arthritis here.  Disc bulge at L4-5 without nerve root impingement.  History reviewed. No pertinent past medical history.  Current Outpatient Prescriptions on File Prior to Visit  Medication Sig Dispense Refill  . Ascorbic Acid (VITAMIN C) 100 MG tablet Take 500 mg by mouth daily.       Marland Kitchen BLACK COHOSH EXTRACT PO Take 1 tablet by mouth daily.        . fish oil-omega-3 fatty acids 1000 MG capsule Take 1 g by mouth daily.       . folic acid (FOLVITE) 1 MG tablet Take 1 mg by mouth daily.        . Ginseng (GIN-ZING PO) Take 1 tablet by mouth daily.        Marland Kitchen glucosamine-chondroitin 500-400 MG tablet Take 1  tablet by mouth daily.       . Misc Natural Products (RED WINE EXTRACT PLUS PO) Take 1 capsule by mouth daily.        Marland Kitchen pyridOXINE (VITAMIN B-6) 100 MG tablet Take 50 mg by mouth daily.       . vitamin B-12 (CYANOCOBALAMIN) 100 MCG tablet Take 50 mcg by mouth daily.       . vitamin E 100 UNIT capsule Take 100 Units by mouth daily.       . vitamin E 400 UNIT capsule Take 400 Units by mouth daily.         No current facility-administered medications on file prior to visit.    Past Surgical History  Procedure Laterality Date  . Abdominal hysterectomy    . Cholecystectomy      Allergies  Allergen Reactions  . Sulfonamide Derivatives Anaphylaxis    Has not taken since childhood    History   Social History  . Marital Status: Single    Spouse Name: N/A    Number of Children: N/A  . Years of Education: N/A   Occupational History  . Not on file.   Social History Main Topics  . Smoking status: Former Research scientist (life sciences)  . Smokeless tobacco: Never Used  . Alcohol Use: Yes     Comment: occasional  . Drug Use: No  . Sexual Activity: Not on  file   Other Topics Concern  . Not on file   Social History Narrative  . No narrative on file    History reviewed. No pertinent family history.  BP 163/95  Pulse 63  Ht 5\' 3"  (1.6 m)  Wt 148 lb (67.132 kg)  BMI 26.22 kg/m2  Review of Systems: See HPI above.    Objective:  Physical Exam:  Gen: NAD  Back/bilateral hips: No gross deformity, scoliosis. Mild right sided lumbar paraspinal tenderness.  No midline, no hip tenderness. FROM of back without mild pain on extension > flexion.  Very limited ROM right hip - less than 5 degrees IR and ER.  Left hip 10 degrees ER, 5 degrees IR.  Right hip no longer with painful ROM. Strength LEs 5/5 all muscle groups with out pain. 2+ MSRs in patellar and achilles tendons, equal bilaterally. Negative SLRs. Sensation intact to light touch bilaterally. Negative logrolls.    Assessment & Plan:  1.  Bilateral hip pain - severe right hip DJD - much improved with intraarticular injection.  Can repeat every 3 months if needed.  2. Low back pain - MRI from 2013 suggests on right side symptoms most likely due to facet arthritis though could purely be muscular also.  She would like to repeat injection as this helped a great deal for same symptoms previously.  Will set this up.  Consider formal PT.

## 2013-02-23 NOTE — Assessment & Plan Note (Signed)
severe right hip DJD - much improved with intraarticular injection.  Can repeat every 3 months if needed.

## 2013-02-23 NOTE — Assessment & Plan Note (Signed)
MRI from 2013 suggests on right side symptoms most likely due to facet arthritis though could purely be muscular also.  She would like to repeat injection as this helped a great deal for same symptoms previously.  Will set this up.  Consider formal PT.

## 2013-02-24 ENCOUNTER — Other Ambulatory Visit: Payer: Self-pay | Admitting: Family Medicine

## 2013-02-24 DIAGNOSIS — M545 Low back pain, unspecified: Secondary | ICD-10-CM

## 2013-03-08 ENCOUNTER — Ambulatory Visit
Admission: RE | Admit: 2013-03-08 | Discharge: 2013-03-08 | Disposition: A | Discharge status: Home / Self Care | Payer: Managed Care, Other (non HMO) | Source: Ambulatory Visit | Attending: Family Medicine | Admitting: Family Medicine

## 2013-03-08 ENCOUNTER — Inpatient Hospital Stay: Admission: RE | Admit: 2013-03-08 | Payer: Managed Care, Other (non HMO) | Source: Ambulatory Visit

## 2013-03-08 DIAGNOSIS — M545 Low back pain, unspecified: Secondary | ICD-10-CM

## 2013-03-08 MED ORDER — METHYLPREDNISOLONE ACETATE 40 MG/ML INJ SUSP (RADIOLOG
120.0000 mg | Freq: Once | INTRAMUSCULAR | Status: AC
  Administered 2013-03-08: 120 mg via INTRA_ARTICULAR

## 2013-03-08 MED ORDER — IOHEXOL 180 MG/ML  SOLN
1.0000 mL | Freq: Once | INTRAMUSCULAR | Status: AC | PRN
  Administered 2013-03-08: 1 mL via INTRA_ARTICULAR

## 2013-03-08 NOTE — Discharge Instructions (Signed)

## 2013-05-31 DIAGNOSIS — M5416 Radiculopathy, lumbar region: Secondary | ICD-10-CM | POA: Insufficient documentation

## 2013-09-26 DIAGNOSIS — M5136 Other intervertebral disc degeneration, lumbar region: Secondary | ICD-10-CM | POA: Insufficient documentation

## 2013-09-26 DIAGNOSIS — N611 Abscess of the breast and nipple: Secondary | ICD-10-CM | POA: Insufficient documentation

## 2014-01-20 DIAGNOSIS — C439 Malignant melanoma of skin, unspecified: Secondary | ICD-10-CM

## 2014-01-20 HISTORY — DX: Malignant melanoma of skin, unspecified: C43.9

## 2014-05-03 ENCOUNTER — Other Ambulatory Visit (HOSPITAL_BASED_OUTPATIENT_CLINIC_OR_DEPARTMENT_OTHER): Payer: Managed Care, Other (non HMO)

## 2014-05-03 ENCOUNTER — Other Ambulatory Visit (HOSPITAL_BASED_OUTPATIENT_CLINIC_OR_DEPARTMENT_OTHER): Payer: Self-pay | Admitting: Osteopathic Medicine

## 2014-05-03 DIAGNOSIS — I8390 Asymptomatic varicose veins of unspecified lower extremity: Secondary | ICD-10-CM

## 2014-05-03 DIAGNOSIS — M7989 Other specified soft tissue disorders: Secondary | ICD-10-CM

## 2014-05-05 ENCOUNTER — Ambulatory Visit (HOSPITAL_BASED_OUTPATIENT_CLINIC_OR_DEPARTMENT_OTHER)
Admission: RE | Admit: 2014-05-05 | Discharge: 2014-05-05 | Disposition: A | Discharge status: Home / Self Care | Payer: Managed Care, Other (non HMO) | Source: Ambulatory Visit | Attending: Osteopathic Medicine | Admitting: Osteopathic Medicine

## 2014-05-05 DIAGNOSIS — M7989 Other specified soft tissue disorders: Secondary | ICD-10-CM | POA: Diagnosis not present

## 2014-05-05 DIAGNOSIS — I8390 Asymptomatic varicose veins of unspecified lower extremity: Secondary | ICD-10-CM

## 2014-06-15 ENCOUNTER — Telehealth: Payer: Self-pay | Admitting: Hematology & Oncology

## 2014-06-15 NOTE — Telephone Encounter (Signed)
Lt mess for appt on 5/31 at 10am.

## 2014-06-16 ENCOUNTER — Telehealth: Payer: Self-pay | Admitting: Hematology & Oncology

## 2014-06-16 NOTE — Telephone Encounter (Signed)
I spoke w NEW PATIENT today to remind them of their appointment with Dr. Ennever. Also, advised them to bring all medication bottles and insurance card information. ° °

## 2014-06-20 ENCOUNTER — Other Ambulatory Visit (HOSPITAL_BASED_OUTPATIENT_CLINIC_OR_DEPARTMENT_OTHER): Payer: Managed Care, Other (non HMO)

## 2014-06-20 ENCOUNTER — Ambulatory Visit: Payer: Managed Care, Other (non HMO)

## 2014-06-20 ENCOUNTER — Encounter: Payer: Self-pay | Admitting: Hematology & Oncology

## 2014-06-20 ENCOUNTER — Ambulatory Visit (HOSPITAL_BASED_OUTPATIENT_CLINIC_OR_DEPARTMENT_OTHER): Payer: Managed Care, Other (non HMO) | Admitting: Hematology & Oncology

## 2014-06-20 VITALS — BP 151/79 | HR 80 | Temp 97.6°F | Resp 18 | Wt 133.0 lb

## 2014-06-20 DIAGNOSIS — C4371 Malignant melanoma of right lower limb, including hip: Secondary | ICD-10-CM

## 2014-06-20 LAB — CBC WITH DIFFERENTIAL (CANCER CENTER ONLY)
BASO#: 0 10*3/uL (ref 0.0–0.2)
BASO%: 0.2 % (ref 0.0–2.0)
EOS%: 1 % (ref 0.0–7.0)
Eosinophils Absolute: 0.1 10*3/uL (ref 0.0–0.5)
HCT: 41.1 % (ref 34.8–46.6)
HGB: 13.7 g/dL (ref 11.6–15.9)
LYMPH#: 1.4 10*3/uL (ref 0.9–3.3)
LYMPH%: 14.9 % (ref 14.0–48.0)
MCH: 32.4 pg (ref 26.0–34.0)
MCHC: 33.3 g/dL (ref 32.0–36.0)
MCV: 97 fL (ref 81–101)
MONO#: 0.5 10*3/uL (ref 0.1–0.9)
MONO%: 5 % (ref 0.0–13.0)
NEUT%: 78.9 % (ref 39.6–80.0)
NEUTROS ABS: 7.2 10*3/uL — AB (ref 1.5–6.5)
PLATELETS: 249 10*3/uL (ref 145–400)
RBC: 4.23 10*6/uL (ref 3.70–5.32)
RDW: 13.9 % (ref 11.1–15.7)
WBC: 9.1 10*3/uL (ref 3.9–10.0)

## 2014-06-20 LAB — COMPREHENSIVE METABOLIC PANEL
ALBUMIN: 4.1 g/dL (ref 3.5–5.2)
ALT: 14 U/L (ref 0–35)
AST: 13 U/L (ref 0–37)
Alkaline Phosphatase: 78 U/L (ref 39–117)
BUN: 14 mg/dL (ref 6–23)
CHLORIDE: 104 meq/L (ref 96–112)
CO2: 29 mEq/L (ref 19–32)
Calcium: 9.3 mg/dL (ref 8.4–10.5)
Creatinine, Ser: 0.77 mg/dL (ref 0.50–1.10)
GLUCOSE: 129 mg/dL — AB (ref 70–99)
Potassium: 3.9 mEq/L (ref 3.5–5.3)
SODIUM: 142 meq/L (ref 135–145)
Total Bilirubin: 0.4 mg/dL (ref 0.2–1.2)
Total Protein: 6.3 g/dL (ref 6.0–8.3)

## 2014-06-20 LAB — LACTATE DEHYDROGENASE: LDH: 191 U/L (ref 94–250)

## 2014-06-20 NOTE — Progress Notes (Signed)
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 : I have melanoma on my foot  HPI: Michaela Jefferson is a very charming 74 year old African-American female. She has a past history of uterine cancer about 30 years ago. She underwent a hysterectomy. There is a history of pancreatic cancer, breast cancer and prostate cancer in her family. As such, I think that she probably needs to have genetic testing done to make sure that she does not carry the BRCA2 gene. She has 3 children. She apparently has been having a lot of right hip problems. She's not been able to move the right hip all that well. Because this, she's not been able to look at the bottom of her feet.  She is going to have hip surgery at some point. She's not sure when she will have this.  She developed acute swelling of the right foot a few weeks ago. Because this, she went to see her doctor. Her doctor noted a discolored lesion on the right heel. As such, he sent her to a podiatrist. He went ahead and did punch biopsies. He did 3 punch biopsies. The pathology report ([id number]) showed melanoma in situ.  Because this, she was coming referred to the [facility] for an evaluation.  She is active. She works at [employer]. She has had problems with her right hip and needs surgery for this.  She has not had any swelling of the right leg. She's had no problems with bowels or bladder habits. She has had no cough.  Overall, her performance status is ECOG 1.  She and her daughter and granddaughter just Back from [place]. She had a great time in [place].             History reviewed. No pertinent past medical history.:  Past Surgical History  Procedure Laterality Date  . Abdominal hysterectomy    . Cholecystectomy    :   Current outpatient prescriptions:  .  acetaminophen (TYLENOL) 325 MG tablet, Take 650 mg by mouth every 6 (six) hours as needed., Disp: , Rfl:  .  diclofenac  (VOLTAREN) 75 MG EC tablet, Take 75 mg by mouth 2 (two) times daily., Disp: , Rfl:  .  HYDROcodone-acetaminophen (NORCO/VICODIN) 5-325 MG per tablet, Take 1 tablet by mouth every 6 (six) hours as needed for moderate pain., Disp: , Rfl:  .  ranitidine (ZANTAC) 150 MG tablet, Take 150 mg by mouth 2 (two) times daily., Disp: , Rfl:  .  Ascorbic Acid (VITAMIN C) 100 MG tablet, Take 500 mg by mouth daily. , Disp: , Rfl:  .  BLACK COHOSH EXTRACT PO, Take 1 tablet by mouth daily.  , Disp: , Rfl:  .  fish oil-omega-3 fatty acids 1000 MG capsule, Take 1 g by mouth daily. , Disp: , Rfl:  .  folic acid (FOLVITE) 1 MG tablet, Take 1 mg by mouth daily.  , Disp: , Rfl:  .  Ginseng (GIN-ZING PO), Take 1 tablet by mouth daily.  , Disp: , Rfl:  .  glucosamine-chondroitin 500-400 MG tablet, Take 1 tablet by mouth daily. , Disp: , Rfl:  .  Misc Natural Products (RED WINE EXTRACT PLUS PO), Take 1 capsule by mouth daily.  , Disp: , Rfl:  .  pyridOXINE (VITAMIN B-6) 100 MG tablet, Take 50 mg by mouth daily. , Disp: , Rfl:  .  vitamin B-12 (CYANOCOBALAMIN) 100 MCG tablet, Take 50 mcg by  mouth daily. , Disp: , Rfl:  .  vitamin E 400 UNIT capsule, Take 400 Units by mouth daily.  , Disp: , Rfl: :  :  Allergies  Allergen Reactions  . Sulfonamide Derivatives Anaphylaxis    Has not taken since childhood  :  History reviewed. No pertinent family history.:  History   Social History  . Marital Status: Single    Spouse Name: N/A  . Number of Children: N/A  . Years of Education: N/A   Occupational History  . Not on file.   Social History Main Topics  . Smoking status: Former Smoker    Quit date: 01/21/1996  . Smokeless tobacco: Never Used  . Alcohol Use: Yes     Comment: occasional  . Drug Use: No  . Sexual Activity: Not on file   Other Topics Concern  . Not on file   Social History Narrative  :  Pertinent items are noted in HPI.  Exam: @IPVITALS @  [place] female in no obvious  distress. Vital signs show a temperature of 97.6. Pulse 80. Blood pressure 151/79. Weight is 133 pounds. Head and neck exam shows no ocular or oral lesions. She has no palpable cervical or supraclavicular lymph nodes. Lungs are clear. Cardiac exam regular rate and rhythm with no murmurs, rubs or bruits. Abdomen is soft. She has good bowel sounds. She has a laparotomy scar from her hysterectomy. There is no inguinal adenopathy bilaterally. There is no palpable liver or spleen tip. Back exam shows no tenderness over the spine, ribs or hips. Extremities shows this irregular shaped lesion on the bottom of her right foot. This is on the lateral aspect of the heel. It probably measures about 3 x 5 cm. It is not raised. It is not tender. She has good pulses in her distal extremities. Skin exam shows no suspicious skin lesions. Neurological exam shows no focal neurological deficits.    Recent Labs  06/20/14 1050  WBC 9.1  HGB 13.7  HCT 41.1  PLT 249   No results for input(s): NA, K, CL, CO2, GLUCOSE, BUN, CREATININE, CALCIUM in the last 72 hours.  Blood smear review: none  Pathology:See above    Assessment and Plan: Michaela Jefferson is a very charming 74 year old African-American female. 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  I think the issue is whether or not she actually has actual invasive melanoma. Unfortunately, I think normal weight we will find this out is to resect this lesion.  I think we will have to refer her to a plastic and reconstructive surgeon. I would think that in this area of the body, she will need some type of skin graft.  Whether or not she needs any additional surgical intervention (i.e. sentinel node biopsy) will really be dictated by the final pathology on the melanoma in situ surgery.  I spoke to her at length. She understands the reason why surgery is indicated.  We will see about making a referral to plastic and reconstructive surgery. Hopefully they can see her  soon and get her set up for surgery.  The right hip, ultimately, really needs to be fixed. This is a cause her more problems than anything else and ultimately will make her life better.  I think we have to get her back to see [place] until she has surgery for this melanoma in situ and we can see what the pathology shows.  I spent about an hour with her. I answered all  of her questions. We did send off a genetic test for BRCA2.  Pete E. a scan and again for another one from eating

## 2014-07-07 ENCOUNTER — Encounter: Payer: Self-pay | Admitting: Nurse Practitioner

## 2014-07-07 NOTE — Progress Notes (Signed)
Orders faxed back to [facility] to proceed with [facility] testing including my risk results. Confirmation received for faxed received.

## 2014-07-18 ENCOUNTER — Encounter: Payer: Self-pay | Admitting: Hematology & Oncology

## 2014-07-20 ENCOUNTER — Encounter (HOSPITAL_BASED_OUTPATIENT_CLINIC_OR_DEPARTMENT_OTHER): Payer: Self-pay | Admitting: *Deleted

## 2014-07-25 ENCOUNTER — Other Ambulatory Visit: Payer: Self-pay | Admitting: Plastic Surgery

## 2014-07-25 DIAGNOSIS — C439 Malignant melanoma of skin, unspecified: Secondary | ICD-10-CM

## 2014-07-25 NOTE — H&P (Signed)
Michaela Jefferson is an 74 y.o. female.   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 We are sending for the pathology report. She had uterine cancer 30 yrs ago and underwent a hysterectomy. Her fmaily history is positive for pancreatic cancer, breast and prostate cancer. She has hip pain and has not been able to exam the bottom of her feet. When she had her nails done she was told that she had a dark spot which has been for ~ 6 months. It is on the heel of her right foot. No nodes are noted to be enlarged. It is ~ 4 x 3 cm in size and black in color with irregular borders. The area from the biopsy has healed well.  Past Medical History  Diagnosis Date  . DDD (degenerative disc disease), lumbar   . Hypertension   . Hyperlipidemia   . DJD (degenerative joint disease)   . Melanoma 2016    right heel    Past Surgical History  Procedure Laterality Date  . Abdominal hysterectomy    . Cholecystectomy      No family history on file. Social History:  reports that she quit smoking about 18 years ago. She has never used smokeless tobacco. She reports that she drinks alcohol. She reports that she does not use illicit drugs.  Allergies:  Allergies  Allergen Reactions  . Sulfonamide Derivatives Anaphylaxis    Has not taken since childhood     (Not in a hospital admission)  No results found for this or any previous visit (from the past 48 hour(s)). No results found.  Review of Systems  Constitutional: Negative.   HENT: Negative.   Eyes: Negative.   Respiratory: Negative.   Cardiovascular: Negative.   Gastrointestinal: Negative.   Genitourinary: Negative.   Musculoskeletal: Negative.   Skin: Negative.   Neurological: Negative.   Psychiatric/Behavioral: Negative.     There were no vitals taken for this visit. Physical Exam    Constitutional: She is oriented to person, place, and time. She appears well-developed and well-nourished.  HENT:  Head: Normocephalic and atraumatic.  Eyes: Conjunctivae and EOM are normal. Pupils are equal, round, and reactive to light.  Cardiovascular: Normal rate.   Respiratory: Effort normal.  GI: Soft.  Musculoskeletal:       Feet:  Neurological: She is alert and oriented to person, place, and time.  Skin: Skin is warm.  Psychiatric: She has a normal mood and affect. Her behavior is normal. Judgment and thought content normal.     Assessment/Plan Excision of right heel melanoma with placement of Acell.  Risks include need for further surgery, infection, bleeding, pain and scar.  SANGER,CLAIRE 07/25/2014, 3:26 PM

## 2014-07-26 ENCOUNTER — Ambulatory Visit (HOSPITAL_BASED_OUTPATIENT_CLINIC_OR_DEPARTMENT_OTHER)
Admission: RE | Admit: 2014-07-26 | Discharge: 2014-07-26 | Disposition: A | Discharge status: Home / Self Care | Payer: Managed Care, Other (non HMO) | Source: Ambulatory Visit | Attending: Plastic Surgery | Admitting: Plastic Surgery

## 2014-07-26 ENCOUNTER — Encounter (HOSPITAL_BASED_OUTPATIENT_CLINIC_OR_DEPARTMENT_OTHER): Payer: Self-pay | Admitting: Plastic Surgery

## 2014-07-26 ENCOUNTER — Encounter (HOSPITAL_BASED_OUTPATIENT_CLINIC_OR_DEPARTMENT_OTHER)
Admission: RE | Disposition: A | Discharge status: Home / Self Care | Payer: Self-pay | Source: Ambulatory Visit | Attending: Plastic Surgery

## 2014-07-26 ENCOUNTER — Ambulatory Visit (HOSPITAL_BASED_OUTPATIENT_CLINIC_OR_DEPARTMENT_OTHER): Payer: Managed Care, Other (non HMO) | Admitting: Anesthesiology

## 2014-07-26 DIAGNOSIS — C4371 Malignant melanoma of right lower limb, including hip: Secondary | ICD-10-CM | POA: Diagnosis present

## 2014-07-26 DIAGNOSIS — Z8542 Personal history of malignant neoplasm of other parts of uterus: Secondary | ICD-10-CM | POA: Insufficient documentation

## 2014-07-26 DIAGNOSIS — Z8701 Personal history of pneumonia (recurrent): Secondary | ICD-10-CM | POA: Insufficient documentation

## 2014-07-26 DIAGNOSIS — E785 Hyperlipidemia, unspecified: Secondary | ICD-10-CM | POA: Insufficient documentation

## 2014-07-26 DIAGNOSIS — Z87891 Personal history of nicotine dependence: Secondary | ICD-10-CM | POA: Diagnosis not present

## 2014-07-26 DIAGNOSIS — M1990 Unspecified osteoarthritis, unspecified site: Secondary | ICD-10-CM | POA: Insufficient documentation

## 2014-07-26 DIAGNOSIS — Z90710 Acquired absence of both cervix and uterus: Secondary | ICD-10-CM | POA: Diagnosis not present

## 2014-07-26 DIAGNOSIS — C439 Malignant melanoma of skin, unspecified: Secondary | ICD-10-CM

## 2014-07-26 DIAGNOSIS — Z85820 Personal history of malignant melanoma of skin: Secondary | ICD-10-CM | POA: Insufficient documentation

## 2014-07-26 DIAGNOSIS — I10 Essential (primary) hypertension: Secondary | ICD-10-CM | POA: Insufficient documentation

## 2014-07-26 DIAGNOSIS — Z79899 Other long term (current) drug therapy: Secondary | ICD-10-CM | POA: Diagnosis not present

## 2014-07-26 DIAGNOSIS — K219 Gastro-esophageal reflux disease without esophagitis: Secondary | ICD-10-CM | POA: Insufficient documentation

## 2014-07-26 DIAGNOSIS — Z882 Allergy status to sulfonamides status: Secondary | ICD-10-CM | POA: Insufficient documentation

## 2014-07-26 HISTORY — DX: Hyperlipidemia, unspecified: E78.5

## 2014-07-26 HISTORY — PX: MASS EXCISION: SHX2000

## 2014-07-26 HISTORY — DX: Unspecified osteoarthritis, unspecified site: M19.90

## 2014-07-26 HISTORY — PX: APPLICATION OF A-CELL OF EXTREMITY: SHX6303

## 2014-07-26 HISTORY — DX: Other intervertebral disc degeneration, lumbar region: M51.36

## 2014-07-26 HISTORY — DX: Other intervertebral disc degeneration, lumbar region without mention of lumbar back pain or lower extremity pain: M51.369

## 2014-07-26 HISTORY — DX: Malignant melanoma of skin, unspecified: C43.9

## 2014-07-26 SURGERY — EXCISION MASS
Anesthesia: General | Site: Foot | Laterality: Right

## 2014-07-26 MED ORDER — HYDROMORPHONE HCL 1 MG/ML IJ SOLN: 0.2500 mg | INTRAMUSCULAR | Status: DC | PRN

## 2014-07-26 MED ORDER — HYDROCODONE-ACETAMINOPHEN 5-325 MG PO TABS: 1.0000 | ORAL_TABLET | Freq: Four times a day (QID) | ORAL | Status: DC | PRN

## 2014-07-26 MED ORDER — CEFAZOLIN SODIUM-DEXTROSE 2-3 GM-% IV SOLR
INTRAVENOUS | Status: AC
  Filled 2014-07-26: qty 50

## 2014-07-26 MED ORDER — PROPOFOL 10 MG/ML IV BOLUS
INTRAVENOUS | Status: DC | PRN
  Administered 2014-07-26: 200 mg via INTRAVENOUS

## 2014-07-26 MED ORDER — LACTATED RINGERS IV SOLN
INTRAVENOUS | Status: DC
  Administered 2014-07-26: 09:00:00 via INTRAVENOUS

## 2014-07-26 MED ORDER — FENTANYL CITRATE (PF) 100 MCG/2ML IJ SOLN
50.0000 ug | INTRAMUSCULAR | Status: DC | PRN
  Administered 2014-07-26: 50 ug via INTRAVENOUS

## 2014-07-26 MED ORDER — LIDOCAINE-EPINEPHRINE 1 %-1:100000 IJ SOLN
INTRAMUSCULAR | Status: DC | PRN
  Administered 2014-07-26: 9 mL

## 2014-07-26 MED ORDER — FENTANYL CITRATE (PF) 100 MCG/2ML IJ SOLN
INTRAMUSCULAR | Status: AC
  Filled 2014-07-26: qty 4

## 2014-07-26 MED ORDER — HYDROCODONE-ACETAMINOPHEN 5-325 MG PO TABS
ORAL_TABLET | ORAL | Status: AC
Start: 2014-07-26 — End: 2014-07-26
  Filled 2014-07-26: qty 1

## 2014-07-26 MED ORDER — SCOPOLAMINE 1 MG/3DAYS TD PT72: 1.0000 | MEDICATED_PATCH | Freq: Once | TRANSDERMAL | Status: DC | PRN

## 2014-07-26 MED ORDER — MIDAZOLAM HCL 2 MG/2ML IJ SOLN: 1.0000 mg | INTRAMUSCULAR | Status: DC | PRN

## 2014-07-26 MED ORDER — HYDROCODONE-ACETAMINOPHEN 5-325 MG PO TABS
1.0000 | ORAL_TABLET | Freq: Once | ORAL | Status: AC
  Administered 2014-07-26: 1 via ORAL

## 2014-07-26 MED ORDER — LIDOCAINE HCL (CARDIAC) 20 MG/ML IV SOLN
INTRAVENOUS | Status: DC | PRN
  Administered 2014-07-26: 50 mg via INTRAVENOUS

## 2014-07-26 MED ORDER — MIDAZOLAM HCL 2 MG/2ML IJ SOLN
INTRAMUSCULAR | Status: AC
  Filled 2014-07-26: qty 2

## 2014-07-26 MED ORDER — GLYCOPYRROLATE 0.2 MG/ML IJ SOLN: 0.2000 mg | Freq: Once | INTRAMUSCULAR | Status: DC | PRN

## 2014-07-26 MED ORDER — CEFAZOLIN SODIUM-DEXTROSE 2-3 GM-% IV SOLR
2.0000 g | INTRAVENOUS | Status: AC
  Administered 2014-07-26: 2 g via INTRAVENOUS

## 2014-07-26 SURGICAL SUPPLY — 97 items
BAG DECANTER FOR FLEXI CONT (MISCELLANEOUS) IMPLANT
BANDAGE ELASTIC 3 VELCRO ST LF (GAUZE/BANDAGES/DRESSINGS) IMPLANT
BANDAGE ELASTIC 4 VELCRO ST LF (GAUZE/BANDAGES/DRESSINGS) ×3 IMPLANT
BANDAGE ELASTIC 6 VELCRO ST LF (GAUZE/BANDAGES/DRESSINGS) IMPLANT
BENZOIN TINCTURE PRP APPL 2/3 (GAUZE/BANDAGES/DRESSINGS) IMPLANT
BLADE CLIPPER SURG (BLADE) IMPLANT
BLADE HEX COATED 2.75 (ELECTRODE) ×3 IMPLANT
BLADE SURG 10 STRL SS (BLADE) ×3 IMPLANT
BLADE SURG 15 STRL LF DISP TIS (BLADE) ×2 IMPLANT
BLADE SURG 15 STRL SS (BLADE) ×4
BNDG COHESIVE 4X5 TAN STRL (GAUZE/BANDAGES/DRESSINGS) IMPLANT
BNDG CONFORM 2 STRL LF (GAUZE/BANDAGES/DRESSINGS) IMPLANT
BNDG ELASTIC 2 VLCR STRL LF (GAUZE/BANDAGES/DRESSINGS) IMPLANT
BNDG GAUZE ELAST 4 BULKY (GAUZE/BANDAGES/DRESSINGS) ×3 IMPLANT
BOOT STEPPER DURA MED (SOFTGOODS) ×3 IMPLANT
CANISTER SUCT 1200ML W/VALVE (MISCELLANEOUS) ×3 IMPLANT
CHLORAPREP W/TINT 26ML (MISCELLANEOUS) ×3 IMPLANT
CLEANER CAUTERY TIP 5X5 PAD (MISCELLANEOUS) IMPLANT
CLOSURE WOUND 1/2 X4 (GAUZE/BANDAGES/DRESSINGS)
CORDS BIPOLAR (ELECTRODE) IMPLANT
COVER BACK TABLE 60X90IN (DRAPES) ×3 IMPLANT
COVER MAYO STAND STRL (DRAPES) ×3 IMPLANT
DECANTER SPIKE VIAL GLASS SM (MISCELLANEOUS) IMPLANT
DRAPE INCISE IOBAN 66X45 STRL (DRAPES) IMPLANT
DRAPE LAPAROTOMY 100X72 PEDS (DRAPES) IMPLANT
DRAPE U-SHAPE 76X120 STRL (DRAPES) ×3 IMPLANT
DRSG ADAPTIC 3X8 NADH LF (GAUZE/BANDAGES/DRESSINGS) ×3 IMPLANT
DRSG EMULSION OIL 3X3 NADH (GAUZE/BANDAGES/DRESSINGS) IMPLANT
DRSG PAD ABDOMINAL 8X10 ST (GAUZE/BANDAGES/DRESSINGS) ×3 IMPLANT
DRSG TEGADERM 2-3/8X2-3/4 SM (GAUZE/BANDAGES/DRESSINGS) IMPLANT
DRSG TEGADERM 4X4.75 (GAUZE/BANDAGES/DRESSINGS) IMPLANT
ELECT COATED BLADE 2.86 ST (ELECTRODE) IMPLANT
ELECT NEEDLE BLADE 2-5/6 (NEEDLE) IMPLANT
ELECT REM PT RETURN 9FT ADLT (ELECTROSURGICAL) ×3
ELECT REM PT RETURN 9FT PED (ELECTROSURGICAL)
ELECTRODE REM PT RETRN 9FT PED (ELECTROSURGICAL) IMPLANT
ELECTRODE REM PT RTRN 9FT ADLT (ELECTROSURGICAL) ×1 IMPLANT
GAUZE SPONGE 4X4 12PLY STRL (GAUZE/BANDAGES/DRESSINGS) ×3 IMPLANT
GLOVE BIO SURGEON STRL SZ 6.5 (GLOVE) ×6 IMPLANT
GLOVE BIO SURGEONS STRL SZ 6.5 (GLOVE) ×3
GLOVE BIOGEL PI IND STRL 7.0 (GLOVE) ×1 IMPLANT
GLOVE BIOGEL PI INDICATOR 7.0 (GLOVE) ×2
GLOVE ECLIPSE 6.5 STRL STRAW (GLOVE) ×3 IMPLANT
GOWN STRL REUS W/ TWL LRG LVL3 (GOWN DISPOSABLE) ×3 IMPLANT
GOWN STRL REUS W/TWL LRG LVL3 (GOWN DISPOSABLE) ×6
LIQUID BAND (GAUZE/BANDAGES/DRESSINGS) IMPLANT
MATRIX SURGICAL PSM 7X10CM (Tissue) ×3 IMPLANT
MICROMATRIX 500MG (Tissue) ×3 IMPLANT
NEEDLE HYPO 25X1 1.5 SAFETY (NEEDLE) ×3 IMPLANT
NEEDLE HYPO 30GX1 BEV (NEEDLE) IMPLANT
NEEDLE PRECISIONGLIDE 27X1.5 (NEEDLE) IMPLANT
NS IRRIG 1000ML POUR BTL (IV SOLUTION) ×3 IMPLANT
PACK BASIN DAY SURGERY FS (CUSTOM PROCEDURE TRAY) ×3 IMPLANT
PAD CLEANER CAUTERY TIP 5X5 (MISCELLANEOUS)
PADDING CAST ABS 3INX4YD NS (CAST SUPPLIES)
PADDING CAST ABS 4INX4YD NS (CAST SUPPLIES)
PADDING CAST ABS COTTON 3X4 (CAST SUPPLIES) IMPLANT
PADDING CAST ABS COTTON 4X4 ST (CAST SUPPLIES) IMPLANT
PENCIL BUTTON HOLSTER BLD 10FT (ELECTRODE) ×3 IMPLANT
RUBBERBAND STERILE (MISCELLANEOUS) IMPLANT
SHEET MEDIUM DRAPE 40X70 STRL (DRAPES) ×3 IMPLANT
SLEEVE SCD COMPRESS KNEE MED (MISCELLANEOUS) IMPLANT
SOLUTION PARTIC MCRMTRX 500MG (Tissue) ×1 IMPLANT
SPLINT FIBERGLASS 3X35 (CAST SUPPLIES) IMPLANT
SPLINT FIBERGLASS 4X30 (CAST SUPPLIES) IMPLANT
SPONGE GAUZE 2X2 8PLY STER LF (GAUZE/BANDAGES/DRESSINGS)
SPONGE GAUZE 2X2 8PLY STRL LF (GAUZE/BANDAGES/DRESSINGS) IMPLANT
SPONGE GAUZE 4X4 12PLY STER LF (GAUZE/BANDAGES/DRESSINGS) IMPLANT
SPONGE LAP 18X18 X RAY DECT (DISPOSABLE) ×3 IMPLANT
STAPLER VISISTAT 35W (STAPLE) IMPLANT
STOCKINETTE IMPERVIOUS LG (DRAPES) IMPLANT
STRIP CLOSURE SKIN 1/2X4 (GAUZE/BANDAGES/DRESSINGS) IMPLANT
SUCTION FRAZIER TIP 10 FR DISP (SUCTIONS) IMPLANT
SURGILUBE 2OZ TUBE FLIPTOP (MISCELLANEOUS) ×3 IMPLANT
SUT MNCRL 6-0 UNDY P1 1X18 (SUTURE) IMPLANT
SUT MNCRL AB 3-0 PS2 18 (SUTURE) IMPLANT
SUT MNCRL AB 4-0 PS2 18 (SUTURE) IMPLANT
SUT MON AB 5-0 P3 18 (SUTURE) IMPLANT
SUT MON AB 5-0 PS2 18 (SUTURE) IMPLANT
SUT MONOCRYL 6-0 P1 1X18 (SUTURE)
SUT PROLENE 5 0 P 3 (SUTURE) IMPLANT
SUT PROLENE 5 0 PS 2 (SUTURE) IMPLANT
SUT PROLENE 6 0 P 1 18 (SUTURE) IMPLANT
SUT SILK 3 0 PS 1 (SUTURE) ×3 IMPLANT
SUT VIC AB 5-0 P-3 18X BRD (SUTURE) ×2 IMPLANT
SUT VIC AB 5-0 P3 18 (SUTURE) ×4
SUT VIC AB 5-0 PS2 18 (SUTURE) IMPLANT
SUT VICRYL 4-0 PS2 18IN ABS (SUTURE) IMPLANT
SYR BULB 3OZ (MISCELLANEOUS) IMPLANT
SYR BULB IRRIGATION 50ML (SYRINGE) ×3 IMPLANT
SYR CONTROL 10ML LL (SYRINGE) ×3 IMPLANT
TOWEL OR 17X24 6PK STRL BLUE (TOWEL DISPOSABLE) ×3 IMPLANT
TRAY DSU PREP LF (CUSTOM PROCEDURE TRAY) IMPLANT
TUBE CONNECTING 20'X1/4 (TUBING) ×1
TUBE CONNECTING 20X1/4 (TUBING) ×2 IMPLANT
UNDERPAD 30X30 (UNDERPADS AND DIAPERS) ×3 IMPLANT
YANKAUER SUCT BULB TIP NO VENT (SUCTIONS) ×3 IMPLANT

## 2014-07-26 NOTE — Brief Op Note (Signed)
07/26/2014  10:06 AM  PATIENT:  Michaela Jefferson  74 y.o. female  PRE-OPERATIVE DIAGNOSIS:  melanoma  POST-OPERATIVE DIAGNOSIS:  * No post-op diagnosis entered *  PROCEDURE:  Procedure(s): EXCISION OF RIGHT HEEL MELANOMA  (Right) WITH PLACEMENT OF A CELL (Right)  SURGEON:  Surgeon(s) and Role:    * Claire Sanger, DO - Primary  PHYSICIAN ASSISTANT: Shawn Rayburn, PA  ASSISTANTS: none   ANESTHESIA:   general  EBL:  Total I/O In: 500 [I.V.:500] Out: -   BLOOD ADMINISTERED:none  DRAINS: none   LOCAL MEDICATIONS USED:  LIDOCAINE   SPECIMEN:  Source of Specimen:  right heel melanoma  DISPOSITION OF SPECIMEN:  PATHOLOGY  COUNTS:  YES  TOURNIQUET:  * No tourniquets in log *  DICTATION: .Dragon Dictation  PLAN OF CARE: Discharge to home after PACU  PATIENT DISPOSITION:  PACU - hemodynamically stable.   Delay start of Pharmacological VTE agent (>24hrs) due to surgical blood loss or risk of bleeding: no

## 2014-07-26 NOTE — Anesthesia Preprocedure Evaluation (Addendum)
Anesthesia Evaluation  Patient identified by MRN, date of birth, ID band Patient awake    Reviewed: Allergy & Precautions, NPO status , Patient's Chart, lab work & pertinent test results  Airway Mallampati: II  TM Distance: >3 FB Neck ROM: Full    Dental   Pulmonary pneumonia -, former smoker,  breath sounds clear to auscultation        Cardiovascular hypertension, Rhythm:Regular Rate:Normal     Neuro/Psych    GI/Hepatic Neg liver ROS, GERD-  ,  Endo/Other  negative endocrine ROS  Renal/GU      Musculoskeletal   Abdominal   Peds  Hematology   Anesthesia Other Findings   Reproductive/Obstetrics                            Anesthesia Physical Anesthesia Plan  ASA: II  Anesthesia Plan: General   Post-op Pain Management:    Induction: Intravenous  Airway Management Planned: LMA  Additional Equipment:   Intra-op Plan:   Post-operative Plan: Extubation in OR  Informed Consent: I have reviewed the patients History and Physical, chart, labs and discussed the procedure including the risks, benefits and alternatives for the proposed anesthesia with the patient or authorized representative who has indicated his/her understanding and acceptance.   Dental advisory given  Plan Discussed with: CRNA and Anesthesiologist  Anesthesia Plan Comments:         Anesthesia Quick Evaluation

## 2014-07-26 NOTE — Interval H&P Note (Signed)
History and Physical Interval Note:  07/26/2014 7:42 AM  Michaela Jefferson  has presented today for surgery, with the diagnosis of melanoma  The various methods of treatment have been discussed with the patient and family. After consideration of risks, benefits and other options for treatment, the patient has consented to  Procedure(s): EXCISION OF RIGHT HEEL MELANOMA  (Right) WITH PLACEMENT OF A CELL (Right) as a surgical intervention .  The patient's history has been reviewed, patient examined, no change in status, stable for surgery.  I have reviewed the patient's chart and labs.  Questions were answered to the patient's satisfaction.     SANGER,Jenniffer Vessels

## 2014-07-26 NOTE — Discharge Instructions (Signed)
Apply KY gel to the right heel daily and wrap with kerlex and Ace wrap. Touch down only on right leg and wear splint during the day.  Call your surgeon if you experience:   1.  Fever over 101.0. 2.  Inability to urinate. 3.  Nausea and/or vomiting. 4.  Extreme swelling or bruising at the surgical site. 5.  Continued bleeding from the incision. 6.  Increased pain, redness or drainage from the incision. 7.  Problems related to your pain medication. 8. Any change in color, movement and/or sensation 9. Any problems and/or concerns   Post Anesthesia Home Care Instructions  Activity: Get plenty of rest for the remainder of the day. A responsible adult should stay with you for 24 hours following the procedure.  For the next 24 hours, DO NOT: -Drive a car -Paediatric nurse -Drink alcoholic beverages -Take any medication unless instructed by your physician -Make any legal decisions or sign important papers.  Meals: Start with liquid foods such as gelatin or soup. Progress to regular foods as tolerated. Avoid greasy, spicy, heavy foods. If nausea and/or vomiting occur, drink only clear liquids until the nausea and/or vomiting subsides. Call your physician if vomiting continues.  Special Instructions/Symptoms: Your throat may feel dry or sore from the anesthesia or the breathing tube placed in your throat during surgery. If this causes discomfort, gargle with warm salt water. The discomfort should disappear within 24 hours.  If you had a scopolamine patch placed behind your ear for the management of post- operative nausea and/or vomiting:  1. The medication in the patch is effective for 72 hours, after which it should be removed.  Wrap patch in a tissue and discard in the trash. Wash hands thoroughly with soap and water. 2. You may remove the patch earlier than 72 hours if you experience unpleasant side effects which may include dry mouth, dizziness or visual disturbances. 3. Avoid  touching the patch. Wash your hands with soap and water after contact with the patch.

## 2014-07-26 NOTE — Transfer of Care (Signed)
Immediate Anesthesia Transfer of Care Note  Patient: Michaela Jefferson  Procedure(s) Performed: Procedure(s): EXCISION OF RIGHT HEEL MELANOMA  (Right) WITH PLACEMENT OF A CELL (Right)  Patient Location: PACU  Anesthesia Type:General  Level of Consciousness: awake and alert   Airway & Oxygen Therapy: Patient Spontanous Breathing and Patient connected to face mask oxygen  Post-op Assessment: Report given to RN and Post -op Vital signs reviewed and stable  Post vital signs: Reviewed and stable  Last Vitals:  Filed Vitals:   07/26/14 0833  BP: 169/82  Pulse: 70  Temp: 36.5 C  Resp: 20    Complications: No apparent anesthesia complications

## 2014-07-26 NOTE — Anesthesia Postprocedure Evaluation (Signed)
  Anesthesia Post-op Note  Patient: Michaela Jefferson  Procedure(s) Performed: Procedure(s): EXCISION OF RIGHT HEEL MELANOMA  (Right) WITH PLACEMENT OF A CELL TO RIGHT HEEL (Right)  Patient Location: PACU  Anesthesia Type:General  Level of Consciousness: awake  Airway and Oxygen Therapy: Patient Spontanous Breathing  Post-op Pain: mild  Post-op Assessment: Post-op Vital signs reviewed              Post-op Vital Signs: Reviewed  Last Vitals:  Filed Vitals:   07/26/14 1015  BP: 162/79  Pulse: 64  Temp:   Resp: 12    Complications: No apparent anesthesia complications

## 2014-07-26 NOTE — Op Note (Signed)
Operative Note   DATE OF OPERATION: 07/26/2014  LOCATION: [place]  SURGICAL DIVISION: Plastic Surgery  PREOPERATIVE DIAGNOSES:  Right heel melanoma in situ  POSTOPERATIVE DIAGNOSES:  same  PROCEDURE:  Excision of right heel melanoma in situ 6 x 6.5 cm with placement of Acell (7 x 10 cm sheet and 500 mg powder)  SURGEON: Theodoro Kos, DO  ASSISTANT: Shawn Rayburn, PA  ANESTHESIA:  General.   COMPLICATIONS: None.   INDICATIONS FOR PROCEDURE:  The patient, Michaela Jefferson is a 74 y.o. female born on 1940-04-16, is here for treatment of a melanoma in situ of the right heel.  The area was very irregular and therefore 1 cm margins were obtained. MRN: [id number]  CONSENT:  Informed consent was obtained directly from the patient. Risks, benefits and alternatives were fully discussed. Specific risks including but not limited to bleeding, infection, hematoma, seroma, scarring, pain, infection, contracture, asymmetry, wound healing problems, and need for further surgery were all discussed. The patient did have an ample opportunity to have questions answered to satisfaction.   DESCRIPTION OF PROCEDURE:  The patient was taken to the operating room.  IV antibiotics were given. The patient's operative site was prepped and draped in a sterile fashion. A time out was performed and all information was confirmed to be correct.  General anesthesia was administered.  The area was marked with 1 cm margins.  Local was injected.  After waiting several minutes for the local to take effect the #10 blade was used to excise the lesion.  The lesion was marked with a short stitch at 12 o'clock and long stitch at 3 o'clock.  The specimen was sent to path.  The bovie was used to obtain hemostasis.  All the Acell powder and 6 x 6.5 cm of the Acell sheet were utilized and secured to the defect with 5-0 Vicryl.   Adaptic was applied and ky gel.  The area was closed with 4 x 4 gauze, kerlex and  an Ace wrap. She was then placed in a protective boot. The patient tolerated the procedure well.  There were no complications. The patient was allowed to wake from anesthesia, extubated and taken to the recovery room in satisfactory condition.

## 2014-07-26 NOTE — H&P (View-Only) (Signed)
Michaela Jefferson is an 74 y.o. female.   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 We are sending for the pathology report. She had uterine cancer 30 yrs ago and underwent a hysterectomy. Her fmaily history is positive for pancreatic cancer, breast and prostate cancer. She has hip pain and has not been able to exam the bottom of her feet. When she had her nails done she was told that she had a dark spot which has been for ~ 6 months. It is on the heel of her right foot. No nodes are noted to be enlarged. It is ~ 4 x 3 cm in size and black in color with irregular borders. The area from the biopsy has healed well.  Past Medical History  Diagnosis Date  . DDD (degenerative disc disease), lumbar   . Hypertension   . Hyperlipidemia   . DJD (degenerative joint disease)   . Melanoma 2016    right heel    Past Surgical History  Procedure Laterality Date  . Abdominal hysterectomy    . Cholecystectomy      No family history on file. Social History:  reports that she quit smoking about 18 years ago. She has never used smokeless tobacco. She reports that she drinks alcohol. She reports that she does not use illicit drugs.  Allergies:  Allergies  Allergen Reactions  . Sulfonamide Derivatives Anaphylaxis    Has not taken since childhood     (Not in a hospital admission)  No results found for this or any previous visit (from the past 48 hour(s)). No results found.  Review of Systems  Constitutional: Negative.   HENT: Negative.   Eyes: Negative.   Respiratory: Negative.   Cardiovascular: Negative.   Gastrointestinal: Negative.   Genitourinary: Negative.   Musculoskeletal: Negative.   Skin: Negative.   Neurological: Negative.   Psychiatric/Behavioral: Negative.     There were no vitals taken for this visit. Physical Exam    Constitutional: She is oriented to person, place, and time. She appears well-developed and well-nourished.  HENT:  Head: Normocephalic and atraumatic.  Eyes: Conjunctivae and EOM are normal. Pupils are equal, round, and reactive to light.  Cardiovascular: Normal rate.   Respiratory: Effort normal.  GI: Soft.  Musculoskeletal:       Feet:  Neurological: She is alert and oriented to person, place, and time.  Skin: Skin is warm.  Psychiatric: She has a normal mood and affect. Her behavior is normal. Judgment and thought content normal.     Assessment/Plan Excision of right heel melanoma with placement of Acell.  Risks include need for further surgery, infection, bleeding, pain and scar.  SANGER,Michaela Jefferson 07/25/2014, 3:26 PM

## 2014-07-26 NOTE — Anesthesia Procedure Notes (Signed)
Procedure Name: LMA Insertion Date/Time: 07/26/2014 9:33 AM Performed by: Lieutenant Diego Pre-anesthesia Checklist: Patient identified, Emergency Drugs available, Suction available and Patient being monitored Patient Re-evaluated:Patient Re-evaluated prior to inductionOxygen Delivery Method: Circle System Utilized Preoxygenation: Pre-oxygenation with 100% oxygen Intubation Type: IV induction Ventilation: Mask ventilation without difficulty LMA: LMA inserted LMA Size: 4.0 Number of attempts: 1 Airway Equipment and Method: Bite block Placement Confirmation: positive ETCO2 and breath sounds checked- equal and bilateral Tube secured with: Tape Dental Injury: Teeth and Oropharynx as per pre-operative assessment

## 2014-07-27 ENCOUNTER — Encounter (HOSPITAL_BASED_OUTPATIENT_CLINIC_OR_DEPARTMENT_OTHER): Payer: Self-pay | Admitting: Plastic Surgery

## 2014-08-04 ENCOUNTER — Other Ambulatory Visit: Payer: Self-pay | Admitting: Plastic Surgery

## 2014-08-04 ENCOUNTER — Encounter (HOSPITAL_BASED_OUTPATIENT_CLINIC_OR_DEPARTMENT_OTHER): Payer: Self-pay | Admitting: *Deleted

## 2014-08-04 DIAGNOSIS — C4371 Malignant melanoma of right lower limb, including hip: Secondary | ICD-10-CM

## 2014-08-09 ENCOUNTER — Ambulatory Visit (HOSPITAL_BASED_OUTPATIENT_CLINIC_OR_DEPARTMENT_OTHER)
Admission: RE | Admit: 2014-08-09 | Payer: Managed Care, Other (non HMO) | Source: Ambulatory Visit | Admitting: Plastic Surgery

## 2014-08-09 SURGERY — WIDE EXCISION, LESION, UPPER EXTREMITY
Anesthesia: General | Laterality: Right

## 2014-08-16 ENCOUNTER — Ambulatory Visit: Payer: Self-pay | Admitting: General Surgery

## 2014-08-16 DIAGNOSIS — C4371 Malignant melanoma of right lower limb, including hip: Secondary | ICD-10-CM

## 2014-09-11 ENCOUNTER — Encounter (HOSPITAL_COMMUNITY)
Admission: RE | Admit: 2014-09-11 | Discharge: 2014-09-11 | Disposition: A | Discharge status: Home / Self Care | Payer: Managed Care, Other (non HMO) | Source: Ambulatory Visit | Attending: General Surgery | Admitting: General Surgery

## 2014-09-11 ENCOUNTER — Encounter (HOSPITAL_COMMUNITY): Payer: Self-pay

## 2014-09-11 VITALS — BP 128/78 | HR 83 | Temp 98.9°F | Resp 20 | Ht 62.0 in | Wt 135.0 lb

## 2014-09-11 DIAGNOSIS — Z01812 Encounter for preprocedural laboratory examination: Secondary | ICD-10-CM | POA: Diagnosis not present

## 2014-09-11 DIAGNOSIS — C4371 Malignant melanoma of right lower limb, including hip: Secondary | ICD-10-CM | POA: Insufficient documentation

## 2014-09-11 HISTORY — DX: Pneumonia, unspecified organism: J18.9

## 2014-09-11 HISTORY — DX: Gastro-esophageal reflux disease without esophagitis: K21.9

## 2014-09-11 LAB — CBC
HCT: 40.2 % (ref 36.0–46.0)
Hemoglobin: 13.3 g/dL (ref 12.0–15.0)
MCH: 30.6 pg (ref 26.0–34.0)
MCHC: 33.1 g/dL (ref 30.0–36.0)
MCV: 92.6 fL (ref 78.0–100.0)
PLATELETS: 252 10*3/uL (ref 150–400)
RBC: 4.34 MIL/uL (ref 3.87–5.11)
RDW: 13.4 % (ref 11.5–15.5)
WBC: 6.1 10*3/uL (ref 4.0–10.5)

## 2014-09-11 LAB — NO BLOOD PRODUCTS

## 2014-09-11 NOTE — Progress Notes (Signed)
PCP: Dr. Audrie Gallus Oncologist: Dr. Marin Olp  Pt. States her father has hemophilia. Stated yrs. Ago she used to bleed more when she had wisdom teeth extracted the doctor had to put stitches in,but no studies have been done on her. States no problems now.

## 2014-09-11 NOTE — Pre-Procedure Instructions (Signed)
    Michaela Jefferson  09/11/2014      [facility] [place], [place] [address] [place] [address] Phone: [phone] Fax: [phone]  [facility] [address], [place] [address] Phone: [phone] Fax: [phone]  [place], [place] [address] Phone: [phone] Fax: [phone]    Your procedure is scheduled on Wednesday, Aug. 31  Report to [facility] Admitting at 6:30 A.M.  Call this number if you have problems the morning of surgery:  [phone]               Any questions prior to surgery call [phone] (Monday-Friday 8 am-4 pm)   Remember:  Do not eat food or drink liquids after midnight Tuesday, Aug 30  Take these medicines the morning of surgery with A SIP OF WATER: tylenol or hydrocodone if needed, gabapentin   Do not wear jewelry, make-up or nail polish.  Do not wear lotions, powders, or perfumes.  You may wear deodorant.  Do not shave 48 hours prior to surgery.  Men may shave face and neck.  Do not bring valuables to the hospital.  [facility] : [facility] is not responsible for any belongings or valuables.  Contacts, dentures or bridgework may not be worn into surgery.  Leave your suitcase in the car.  After surgery it may be brought to your room.  For patients admitted to the hospital, discharge time will be determined by your treatment team.  Patients discharged the day of surgery will not be allowed to drive home.   Name and phone number of your driver:     Special instructions:  Review preparing for surgery handout  Please read over the following fact sheets that you were given. Pain Booklet, Coughing and Deep Breathing and Surgical Site Infection Prevention

## 2014-09-17 ENCOUNTER — Other Ambulatory Visit: Payer: Self-pay | Admitting: Plastic Surgery

## 2014-09-17 DIAGNOSIS — C4371 Malignant melanoma of right lower limb, including hip: Secondary | ICD-10-CM

## 2014-09-19 MED ORDER — CHLORHEXIDINE GLUCONATE 4 % EX LIQD: 1.0000 "application " | Freq: Once | CUTANEOUS | Status: DC

## 2014-09-19 MED ORDER — CEFAZOLIN SODIUM-DEXTROSE 2-3 GM-% IV SOLR
2.0000 g | INTRAVENOUS | Status: AC
  Administered 2014-09-20: 2 g via INTRAVENOUS
  Filled 2014-09-19: qty 50

## 2014-09-19 NOTE — H&P (Signed)
History of Present Illness Michaela Neri E. Grandville Silos MD; 08/16/2014 1:11 PM) The patient is a 74 year old female who presents for a preoperative evaluation. Michaela Jefferson is a patient of Dr. Migdalia Dk. She underwent excision of a melanoma on her right heel. Final pathology demonstrated superficial spreading melanoma which was approximately 1.5 mm in thickness. Dr. Migdalia Dk asked me to see her in consultation to consider sentinel lymph node biopsy at the same time as she plans to do a wider excision of the area. I spoke with the patient on the phone last week and discussed our plan and answered her questions as well.   Other Problems Erasmo Leventhal, RN, BSN; 08/16/2014 12:43 PM) Arthritis Back Pain Cervical Cancer Cholelithiasis Gastroesophageal Reflux Disease Hemorrhoids Melanoma  Past Surgical History Erasmo Leventhal, RN, BSN; 08/16/2014 12:43 PM) Colon Polyp Removal - Colonoscopy Colon Polyp Removal - Open Foot Surgery Right. Gallbladder Surgery - Open Hysterectomy (due to cancer) - Partial Oral Surgery Tonsillectomy  Diagnostic Studies History Erasmo Leventhal, RN, BSN; 08/16/2014 12:43 PM) Colonoscopy within last year Mammogram >3 years ago  Allergies Erasmo Leventhal, RN, BSN; 08/16/2014 12:44 PM) Sulfabenzamide *CHEMICALS*  Medication History Erasmo Leventhal, RN, BSN; 08/16/2014 12:45 PM) Hydrocodone-Acetaminophen (5-325MG  Tablet, Oral as needed) Active. Medications Reconciled  Social History Multimedia programmer, RN, BSN; 08/16/2014 12:43 PM) Alcohol use Occasional alcohol use. Caffeine use Carbonated beverages, Coffee, Tea. No drug use Tobacco use Former smoker.  Family History (Erasmo Leventhal, RN, BSN; 08/16/2014 12:43 PM) Alcohol Abuse Father. Arthritis Family Members In General. Bleeding disorder Father. Breast Cancer Family Members In General. Heart Disease Family Members In General. Heart disease in female family member before age  54 Hypertension Brother. Malignant Neoplasm Of Pancreas Mother.  Pregnancy / Birth History Erasmo Leventhal, RN, BSN; 08/16/2014 12:43 PM) Age at menarche 81 years. Contraceptive History Intrauterine device, Oral contraceptives. Gravida 4 Maternal age 4-20 Para 3  Review of Systems Occupational hygienist, BSN; 08/16/2014 12:43 PM) General Not Present- Appetite Loss, Chills, Fatigue, Fever, Night Sweats, Weight Gain and Weight Loss. Skin Not Present- Change in Wart/Mole, Dryness, Hives, Jaundice, New Lesions, Non-Healing Wounds, Rash and Ulcer. HEENT Present- Sinus Pain and Wears glasses/contact lenses. Not Present- Earache, Hearing Loss, Hoarseness, Nose Bleed, Oral Ulcers, Ringing in the Ears, Seasonal Allergies, Sore Throat, Visual Disturbances and Yellow Eyes. Respiratory Not Present- Bloody sputum, Chronic Cough, Difficulty Breathing, Snoring and Wheezing. Breast Not Present- Breast Mass, Breast Pain, Nipple Discharge and Skin Changes. Cardiovascular Present- Leg Cramps and Swelling of Extremities. Not Present- Chest Pain, Difficulty Breathing Lying Down, Palpitations, Rapid Heart Rate and Shortness of Breath. Gastrointestinal Present- Constipation, Difficulty Swallowing and Hemorrhoids. Not Present- Abdominal Pain, Bloating, Bloody Stool, Change in Bowel Habits, Chronic diarrhea, Excessive gas, Gets full quickly at meals, Indigestion, Nausea, Rectal Pain and Vomiting. Female Genitourinary Not Present- Frequency, Nocturia, Painful Urination, Pelvic Pain and Urgency. Musculoskeletal Present- Back Pain, Joint Pain, Joint Stiffness and Swelling of Extremities. Not Present- Muscle Pain and Muscle Weakness. Neurological Present- Numbness, Tingling and Trouble walking. Not Present- Decreased Memory, Fainting, Headaches, Seizures, Tremor and Weakness. Psychiatric Not Present- Anxiety, Bipolar, Change in Sleep Pattern, Depression, Fearful and Frequent crying. Endocrine Not Present- Cold  Intolerance, Excessive Hunger, Hair Changes, Heat Intolerance, Hot flashes and New Diabetes. Hematology Present- Easy Bruising and Excessive bleeding. Not Present- Gland problems, HIV and Persistent Infections.   Vitals Occupational hygienist, BSN; 08/16/2014 12:43 PM) 08/16/2014 12:43 PM Weight: 132 lb Height: 61in Body Surface Area: 1.61 m Body Mass Index: 24.94 kg/m Temp.: 98.33F(Oral)  Pulse: 74 (Regular)  Resp.: 20 (Unlabored)  BP: 130/80 (Sitting, Left Arm, Standard)    Physical Exam Michaela Neri E. Grandville Silos MD; 08/16/2014 1:12 PM) General Mental Status-Alert. General Appearance-Consistent with stated age. Hydration-Well hydrated. Voice-Normal.  Head and Neck Head-normocephalic, atraumatic with no lesions or palpable masses.  Eye Eyeball - Bilateral-Extraocular movements intact. Sclera/Conjunctiva - Bilateral-No scleral icterus.  Chest and Lung Exam Chest and lung exam reveals -quiet, even and easy respiratory effort with no use of accessory muscles and on auscultation, normal breath sounds, no adventitious sounds and normal vocal resonance. Inspection Chest Wall - Normal. Back - normal.  Cardiovascular Cardiovascular examination reveals -on palpation PMI is normal in location and amplitude, no palpable S3 or S4. Normal cardiac borders., normal heart sounds, regular rate and rhythm with no murmurs, carotid auscultation reveals no bruits and normal pedal pulses bilaterally.  Abdomen Inspection Inspection of the abdomen reveals - No Hernias. Skin - Scar - no surgical scars. Palpation/Percussion Palpation and Percussion of the abdomen reveal - Soft, Non Tender, No Rebound tenderness, No Rigidity (guarding) and No hepatosplenomegaly. Auscultation Auscultation of the abdomen reveals - Bowel sounds normal.  Neurologic Neurologic evaluation reveals -alert and oriented x 3 with no impairment of recent or remote memory. Mental  Status-Normal.  Musculoskeletal Normal Exam - Left-Upper Extremity Strength Normal and Lower Extremity Strength Normal. Normal Exam - Right-Upper Extremity Strength Normal and Lower Extremity Strength Normal. Note: Boot right lower extremity, dressing in place right heel   Lymphatic Note: No bilateral inguinal, axillary, cervical, or supraclavicular lymphadenopathy     Assessment & Plan Michaela Neri E. Grandville Silos MD; 08/16/2014 1:14 PM) MELANOMA OF HEEL, RIGHT (172.7  C43.71) Impression: This is an intermediate thickness melanoma. I agree with Dr. Leafy Ro plan for wider excision of the area. At the same time, I will do a sentinel lymph node biopsy of the right inguinal region. Procedure, risks, benefits were discussed in detail with her and she is agreeable. We will schedule together with Dr. Migdalia Dk.  Georganna Skeans, MD, MPH, FACS Trauma: [phone] General Surgery: [phone]

## 2014-09-20 ENCOUNTER — Ambulatory Visit (HOSPITAL_COMMUNITY)
Admission: RE | Admit: 2014-09-20 | Discharge: 2014-09-20 | Disposition: A | Discharge status: Home / Self Care | Payer: Managed Care, Other (non HMO) | Source: Ambulatory Visit | Attending: General Surgery | Admitting: General Surgery

## 2014-09-20 ENCOUNTER — Encounter (HOSPITAL_COMMUNITY): Payer: Self-pay | Admitting: Certified Registered Nurse Anesthetist

## 2014-09-20 ENCOUNTER — Encounter (HOSPITAL_COMMUNITY)
Admission: RE | Disposition: A | Discharge status: Home / Self Care | Payer: Self-pay | Source: Ambulatory Visit | Attending: General Surgery

## 2014-09-20 ENCOUNTER — Ambulatory Visit (HOSPITAL_COMMUNITY): Payer: Managed Care, Other (non HMO) | Admitting: Certified Registered Nurse Anesthetist

## 2014-09-20 DIAGNOSIS — C4371 Malignant melanoma of right lower limb, including hip: Secondary | ICD-10-CM | POA: Insufficient documentation

## 2014-09-20 DIAGNOSIS — E785 Hyperlipidemia, unspecified: Secondary | ICD-10-CM | POA: Insufficient documentation

## 2014-09-20 DIAGNOSIS — Z87891 Personal history of nicotine dependence: Secondary | ICD-10-CM | POA: Diagnosis not present

## 2014-09-20 HISTORY — PX: INGUINAL LYMPH NODE BIOPSY: SHX5865

## 2014-09-20 HISTORY — PX: MASS EXCISION: SHX2000

## 2014-09-20 LAB — NO BLOOD PRODUCTS

## 2014-09-20 SURGERY — BIOPSY, LYMPH NODE, INGUINAL, OPEN
Anesthesia: General | Site: Foot | Laterality: Right

## 2014-09-20 MED ORDER — LIDOCAINE HCL (CARDIAC) 20 MG/ML IV SOLN
INTRAVENOUS | Status: DC | PRN
  Administered 2014-09-20: 50 mg via INTRAVENOUS

## 2014-09-20 MED ORDER — PROPOFOL 10 MG/ML IV BOLUS
INTRAVENOUS | Status: DC | PRN
  Administered 2014-09-20: 90 mg via INTRAVENOUS
  Administered 2014-09-20: 50 mg via INTRAVENOUS

## 2014-09-20 MED ORDER — MIDAZOLAM HCL 5 MG/5ML IJ SOLN
INTRAMUSCULAR | Status: DC | PRN
  Administered 2014-09-20: 2 mg via INTRAVENOUS

## 2014-09-20 MED ORDER — FENTANYL CITRATE (PF) 100 MCG/2ML IJ SOLN
25.0000 ug | INTRAMUSCULAR | Status: DC | PRN
  Administered 2014-09-20 (×3): 50 ug via INTRAVENOUS

## 2014-09-20 MED ORDER — NEOSTIGMINE METHYLSULFATE 10 MG/10ML IV SOLN
INTRAVENOUS | Status: AC
  Filled 2014-09-20: qty 1

## 2014-09-20 MED ORDER — KETOROLAC TROMETHAMINE 30 MG/ML IJ SOLN
INTRAMUSCULAR | Status: AC
  Administered 2014-09-20: 30 mg
  Filled 2014-09-20: qty 1

## 2014-09-20 MED ORDER — FENTANYL CITRATE (PF) 100 MCG/2ML IJ SOLN
INTRAMUSCULAR | Status: AC
  Filled 2014-09-20: qty 2

## 2014-09-20 MED ORDER — ESMOLOL HCL 10 MG/ML IV SOLN
INTRAVENOUS | Status: DC | PRN
  Administered 2014-09-20: 40 mg via INTRAVENOUS

## 2014-09-20 MED ORDER — DEXAMETHASONE SODIUM PHOSPHATE 4 MG/ML IJ SOLN
INTRAMUSCULAR | Status: AC
  Filled 2014-09-20: qty 1

## 2014-09-20 MED ORDER — GLYCOPYRROLATE 0.2 MG/ML IJ SOLN
INTRAMUSCULAR | Status: DC | PRN
  Administered 2014-09-20: 0.6 mg via INTRAVENOUS

## 2014-09-20 MED ORDER — HYDROMORPHONE HCL 1 MG/ML IJ SOLN
INTRAMUSCULAR | Status: AC
  Filled 2014-09-20: qty 1

## 2014-09-20 MED ORDER — FENTANYL CITRATE (PF) 250 MCG/5ML IJ SOLN
INTRAMUSCULAR | Status: AC
  Filled 2014-09-20: qty 5

## 2014-09-20 MED ORDER — METHYLENE BLUE 1 % INJ SOLN
INTRAMUSCULAR | Status: AC
  Filled 2014-09-20: qty 10

## 2014-09-20 MED ORDER — TECHNETIUM TC 99M SULFUR COLLOID FILTERED
0.5000 | Freq: Once | INTRAVENOUS | Status: AC | PRN
  Administered 2014-09-20: 0.5 via INTRADERMAL

## 2014-09-20 MED ORDER — BUPIVACAINE-EPINEPHRINE 0.5% -1:200000 IJ SOLN
INTRAMUSCULAR | Status: DC | PRN
  Administered 2014-09-20: 10 mL

## 2014-09-20 MED ORDER — BUPIVACAINE-EPINEPHRINE (PF) 0.5% -1:200000 IJ SOLN
INTRAMUSCULAR | Status: AC
  Filled 2014-09-20: qty 30

## 2014-09-20 MED ORDER — ONDANSETRON HCL 4 MG/2ML IJ SOLN
INTRAMUSCULAR | Status: DC | PRN
  Administered 2014-09-20: 4 mg via INTRAVENOUS

## 2014-09-20 MED ORDER — FENTANYL CITRATE (PF) 100 MCG/2ML IJ SOLN
INTRAMUSCULAR | Status: DC | PRN
  Administered 2014-09-20 (×2): 50 ug via INTRAVENOUS
  Administered 2014-09-20: 75 ug via INTRAVENOUS
  Administered 2014-09-20: 50 ug via INTRAVENOUS
  Administered 2014-09-20: 25 ug via INTRAVENOUS

## 2014-09-20 MED ORDER — ESMOLOL HCL 10 MG/ML IV SOLN
INTRAVENOUS | Status: AC
  Filled 2014-09-20: qty 10

## 2014-09-20 MED ORDER — PROPOFOL 10 MG/ML IV BOLUS
INTRAVENOUS | Status: AC
  Filled 2014-09-20: qty 20

## 2014-09-20 MED ORDER — PROMETHAZINE HCL 25 MG/ML IJ SOLN: 6.2500 mg | INTRAMUSCULAR | Status: DC | PRN

## 2014-09-20 MED ORDER — OXYCODONE HCL 5 MG PO TABS
5.0000 mg | ORAL_TABLET | Freq: Once | ORAL | Status: AC
  Administered 2014-09-20: 5 mg via ORAL

## 2014-09-20 MED ORDER — SUCCINYLCHOLINE CHLORIDE 20 MG/ML IJ SOLN
INTRAMUSCULAR | Status: AC
  Filled 2014-09-20: qty 1

## 2014-09-20 MED ORDER — ROCURONIUM BROMIDE 50 MG/5ML IV SOLN
INTRAVENOUS | Status: AC
  Filled 2014-09-20: qty 1

## 2014-09-20 MED ORDER — NEOSTIGMINE METHYLSULFATE 10 MG/10ML IV SOLN
INTRAVENOUS | Status: DC | PRN
  Administered 2014-09-20: 4 mg via INTRAVENOUS

## 2014-09-20 MED ORDER — LACTATED RINGERS IV SOLN
INTRAVENOUS | Status: DC | PRN
  Administered 2014-09-20: 08:00:00 via INTRAVENOUS

## 2014-09-20 MED ORDER — ROCURONIUM BROMIDE 100 MG/10ML IV SOLN
INTRAVENOUS | Status: DC | PRN
  Administered 2014-09-20: 40 mg via INTRAVENOUS

## 2014-09-20 MED ORDER — FENTANYL CITRATE (PF) 100 MCG/2ML IJ SOLN
50.0000 ug | INTRAMUSCULAR | Status: AC | PRN
  Administered 2014-09-20 (×2): 50 ug via INTRAVENOUS

## 2014-09-20 MED ORDER — MIDAZOLAM HCL 2 MG/2ML IJ SOLN
INTRAMUSCULAR | Status: AC
  Filled 2014-09-20: qty 4

## 2014-09-20 MED ORDER — KETOROLAC TROMETHAMINE 30 MG/ML IJ SOLN: 30.0000 mg | Freq: Once | INTRAMUSCULAR | Status: DC

## 2014-09-20 MED ORDER — GLYCOPYRROLATE 0.2 MG/ML IJ SOLN
INTRAMUSCULAR | Status: AC
  Filled 2014-09-20: qty 3

## 2014-09-20 MED ORDER — OXYCODONE HCL 5 MG PO TABS
ORAL_TABLET | ORAL | Status: AC
  Filled 2014-09-20: qty 1

## 2014-09-20 SURGICAL SUPPLY — 48 items
BANDAGE ELASTIC 4 VELCRO ST LF (GAUZE/BANDAGES/DRESSINGS) ×4 IMPLANT
BLADE SURG ROTATE 9660 (MISCELLANEOUS) ×4 IMPLANT
BNDG COHESIVE 4X5 WHT NS (GAUZE/BANDAGES/DRESSINGS) ×4 IMPLANT
CANISTER SUCTION 2500CC (MISCELLANEOUS) ×4 IMPLANT
CHLORAPREP W/TINT 26ML (MISCELLANEOUS) ×4 IMPLANT
CONT SPEC 4OZ CLIKSEAL STRL BL (MISCELLANEOUS) ×8 IMPLANT
COVER SURGICAL LIGHT HANDLE (MISCELLANEOUS) ×4 IMPLANT
DERMABOND ADVANCED (GAUZE/BANDAGES/DRESSINGS) ×2
DERMABOND ADVANCED .7 DNX12 (GAUZE/BANDAGES/DRESSINGS) ×2 IMPLANT
DRAPE PED LAPAROTOMY (DRAPES) ×4 IMPLANT
DRAPE PROXIMA HALF (DRAPES) ×4 IMPLANT
DRAPE UTILITY XL STRL (DRAPES) ×8 IMPLANT
DRSG ADAPTIC 3X8 NADH LF (GAUZE/BANDAGES/DRESSINGS) ×4 IMPLANT
DRSG PAD ABDOMINAL 8X10 ST (GAUZE/BANDAGES/DRESSINGS) ×4 IMPLANT
ELECT CAUTERY BLADE 6.4 (BLADE) ×4 IMPLANT
ELECT REM PT RETURN 9FT ADLT (ELECTROSURGICAL) ×4
ELECTRODE REM PT RTRN 9FT ADLT (ELECTROSURGICAL) ×2 IMPLANT
GAUZE SPONGE 4X4 16PLY XRAY LF (GAUZE/BANDAGES/DRESSINGS) ×4 IMPLANT
GLOVE BIO SURGEON STRL SZ8 (GLOVE) ×4 IMPLANT
GLOVE BIOGEL PI IND STRL 8 (GLOVE) ×2 IMPLANT
GLOVE BIOGEL PI INDICATOR 8 (GLOVE) ×2
GOWN STRL REUS W/ TWL LRG LVL3 (GOWN DISPOSABLE) ×2 IMPLANT
GOWN STRL REUS W/ TWL XL LVL3 (GOWN DISPOSABLE) ×2 IMPLANT
GOWN STRL REUS W/TWL LRG LVL3 (GOWN DISPOSABLE) ×2
GOWN STRL REUS W/TWL XL LVL3 (GOWN DISPOSABLE) ×2
KIT BASIN OR (CUSTOM PROCEDURE TRAY) ×4 IMPLANT
KIT ROOM TURNOVER OR (KITS) ×4 IMPLANT
LIQUID BAND (GAUZE/BANDAGES/DRESSINGS) ×4 IMPLANT
MATRIX SURGICAL PSM 7X10CM (Tissue) ×4 IMPLANT
NEEDLE 22X1 1/2 (OR ONLY) (NEEDLE) ×4 IMPLANT
NS IRRIG 1000ML POUR BTL (IV SOLUTION) ×4 IMPLANT
PACK SURGICAL SETUP 50X90 (CUSTOM PROCEDURE TRAY) ×4 IMPLANT
PAD ARMBOARD 7.5X6 YLW CONV (MISCELLANEOUS) ×4 IMPLANT
PENCIL BUTTON HOLSTER BLD 10FT (ELECTRODE) ×4 IMPLANT
SPONGE GAUZE 4X4 12PLY STER LF (GAUZE/BANDAGES/DRESSINGS) ×4 IMPLANT
SUT MNCRL AB 4-0 PS2 18 (SUTURE) ×4 IMPLANT
SUT SILK 3 0 SH 30 (SUTURE) ×4 IMPLANT
SUT VIC AB 3-0 SH 27 (SUTURE) ×2
SUT VIC AB 3-0 SH 27X BRD (SUTURE) ×2 IMPLANT
SUT VIC AB 5-0 P-3 18XBRD (SUTURE) ×4 IMPLANT
SUT VIC AB 5-0 P3 18 (SUTURE) ×4
SYR BULB 3OZ (MISCELLANEOUS) ×4 IMPLANT
SYR CONTROL 10ML LL (SYRINGE) ×4 IMPLANT
TOWEL OR 17X24 6PK STRL BLUE (TOWEL DISPOSABLE) ×4 IMPLANT
TOWEL OR 17X26 10 PK STRL BLUE (TOWEL DISPOSABLE) ×4 IMPLANT
TUBE CONNECTING 12'X1/4 (SUCTIONS) ×1
TUBE CONNECTING 12X1/4 (SUCTIONS) ×3 IMPLANT
YANKAUER SUCT BULB TIP NO VENT (SUCTIONS) ×4 IMPLANT

## 2014-09-20 NOTE — Anesthesia Postprocedure Evaluation (Signed)
  Anesthesia Post-op Note  Patient: Michaela Jefferson  Procedure(s) Performed: Procedure(s) (LRB): RIGHT INGUINAL SENTINEL  LYMPH NODE BIOPSY (Right) RE-EXCISION OF RIGHT HEEL MELANOMA FOR WIDER MARGINS WITH APPLICATION OF A CELL (Right)  Patient Location: PACU  Anesthesia Type: General  Level of Consciousness: awake and alert   Airway and Oxygen Therapy: Patient Spontanous Breathing  Post-op Pain: mild  Post-op Assessment: Post-op Vital signs reviewed, Patient's Cardiovascular Status Stable, Respiratory Function Stable, Patent Airway and No signs of Nausea or vomiting  Last Vitals:  Filed Vitals:   09/20/14 1030  BP: 199/110  Pulse: 96  Temp:   Resp: 18    Post-op Vital Signs: stable   Complications: No apparent anesthesia complications

## 2014-09-20 NOTE — Op Note (Signed)
Operative Note   DATE OF OPERATION: 09/20/2014  LOCATION: [facility]  SURGICAL DIVISION: Plastic Surgery  PREOPERATIVE DIAGNOSES:  Right heel melanoma   POSTOPERATIVE DIAGNOSES:  same  PROCEDURE:  Excision of right heel melanoma margins 2 x 8 cm with placement of Acell (7 x 10 cm sheet and 500 mg powder)  SURGEON: Theodoro Kos, DO  ASSISTANT: Shawn Rayburn, PA  ANESTHESIA:  General.   COMPLICATIONS: None.   INDICATIONS FOR PROCEDURE:  The patient, Michaela Jefferson is a 74 y.o. female born on [birth year], is here for treatment of a melanoma of the right heel.  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   MRN: [id number]  CONSENT:  Informed consent was obtained directly from the patient. Risks, benefits and alternatives were fully discussed. Specific risks including but not limited to bleeding, infection, hematoma, seroma, scarring, pain, infection, contracture, asymmetry, wound healing problems, and need for further surgery were all discussed. The patient did have an ample opportunity to have questions answered to satisfaction.   DESCRIPTION OF PROCEDURE:  The patient was taken to the operating room.  IV antibiotics were given. The patient's operative site was prepped and draped in a sterile fashion. A time out was performed and all information was confirmed to be correct.  General anesthesia was administered.  While general surgery was performing the SLN dx/bx we started our portion of the case.  The area was marked from the previous healing area of the heel.  This healing area was 3 x 3 cm and the original excision was 6 x 6.5 cm. From the margins of the original excision 2 margins were obtains circumferentially keeping the healing central portion intact.  Local was injected.  After waiting several minutes for the local to take effect the #10 blade was used to excise the margins.  The skin was marked with a short stitch at 12 o'clock and long  stitch at 3 o'clock.  The specimen was sent to path.  The bovie was used to obtain hemostasis.  All the Acell powder and sheet of the Acell were utilized and secured to the defect with 5-0 Vicryl.   Adaptic was applied, secure with 3-0 Silk and ky gel placed.  The area was closed with 4 x 4 gauze, kerlex and an Ace wrap. She was then placed in a protective boot. The patient tolerated the procedure well.  There were no complications. The patient was allowed to wake from anesthesia, extubated and taken to the recovery room in satisfactory condition.

## 2014-09-20 NOTE — Progress Notes (Signed)
Report given to robin roberts rn as caregiver 

## 2014-09-20 NOTE — Interval H&P Note (Signed)
History and Physical Interval Note:  09/20/2014 7:28 AM  Michaela Jefferson  has presented today for surgery, with the diagnosis of melonoma right foort  The various methods of treatment have been discussed with the patient and family. After consideration of risks, benefits and other options for treatment, the patient has consented to  Procedure(s): RIGHT INGUINAL SENTINEL  LYMPH NODE BIOPSY (Right) RE-EXCISION OF RIGHT HEEL MELANOMA FOR WIDER MARGINS WITH APPLICATION OF A CELL (Right) as a surgical intervention .  The patient's history has been reviewed, patient re-examined, site marked, no change in status, stable for surgery.  I have reviewed the patient's chart and labs.  Questions were answered to the patient's satisfaction.    Note: I will do R inguinal SLN BX, Dr Migdalia Dk will follow with the re-excision and application of A-cell. Patient does not remember any reaction to Augmentin and states she can take penicillin without issues.  Quentina Fronek E

## 2014-09-20 NOTE — Progress Notes (Signed)
Late Entry:    Clarified with patient what blood products she refuses and accepts.  Patient states that she would accept platelets, plasma, and cryo.  Patient states that she refuses whole red blood.  New blood refusal form filled out and faxed to blood bank.

## 2014-09-20 NOTE — Progress Notes (Signed)
Notified Dr. Grandville Silos of patient's allergies to Augmentin and Sulfonamides.  Ancef 2g has been ordered.  MD aware.

## 2014-09-20 NOTE — Discharge Instructions (Signed)
KY gel to right heel daily starting on Friday Keep elevated.

## 2014-09-20 NOTE — OR Nursing (Signed)
Fentanyl 50 mcg wasted with Raquel Sarna RN.

## 2014-09-20 NOTE — H&P (Signed)
Michaela Jefferson is an 74 y.o. female.   Chief Complaint: right foot melanoma HPI: The patient is a 74 yrs old bf here for further care of her right foot melanoma.  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  We have therefore coordinated with Dr. Grandville Silos for SLN bx and re-excision.  She has been doing well without any sign of infection.  The Acell that was placed intraoperatively has incoorporated well.  Past Medical History  Diagnosis Date  . DDD (degenerative disc disease), lumbar   . Hyperlipidemia   . DJD (degenerative joint disease)   . Melanoma 2016    right heel  . Hypertension     pt. denies any htn,states never taken any meds for it.  . Pneumonia 2013  . GERD (gastroesophageal reflux disease)     Past Surgical History  Procedure Laterality Date  . Abdominal hysterectomy    . Cholecystectomy    . Mass excision Right 07/26/2014    Procedure: EXCISION OF RIGHT HEEL MELANOMA ;  Surgeon: Theodoro Kos, DO;  Location: [place];  Service: Plastics;  Laterality: Right;  . Application of a-cell of extremity Right 07/26/2014    Procedure: WITH PLACEMENT OF A CELL TO RIGHT HEEL;  Surgeon: Theodoro Kos, DO;  Location: [place];  Service: Plastics;  Laterality: Right;  . Tonsillectomy      History reviewed. No pertinent family history. Social History:  reports that she quit smoking about 18 years ago. She has never used smokeless tobacco. She reports that she drinks alcohol. She reports that she does not use illicit drugs.  Allergies:  Allergies  Allergen Reactions  . Sulfa Antibiotics Anaphylaxis    Has not taken since childhood  . Sulfonamide Derivatives Anaphylaxis    Has not taken since childhood  . Amoxicillin-Pot Clavulanate Itching    Medications Prior to Admission  Medication Sig Dispense Refill  . acetaminophen (TYLENOL) 325 MG tablet Take 650 mg by mouth every 6 (six) hours as needed.    .  gabapentin (NEURONTIN) 100 MG capsule Take 1-3 tablets at night as needed for radicular pain.    Marland Kitchen HYDROcodone-acetaminophen (NORCO) 5-325 MG per tablet Take 1 tablet by mouth every 6 (six) hours as needed for moderate pain. 40 tablet 0    Results for orders placed or performed during the hospital encounter of 09/20/14 (from the past 48 hour(s))  No blood products     Status: None   Collection Time: 09/20/14  7:55 AM  Result Value Ref Range   Transfuse no blood products      TRANSFUSE NO BLOOD PRODUCTS, VERIFIED BY LINDSI FORTE,RN   Nm Sentinel Node Inj-no Rpt (melanoma)  09/20/2014   CLINICAL DATA: melanoma right heel   Sulfur colloid was injected by the nuclear medicine technologist for  melanoma sentinel node.     Review of Systems  Constitutional: Negative.   HENT: Negative.   Eyes: Negative.   Respiratory: Negative.   Cardiovascular: Negative.   Gastrointestinal: Negative.   Genitourinary: Negative.   Musculoskeletal: Negative.   Skin: Negative.   Neurological: Negative.   Psychiatric/Behavioral: Negative.     Blood pressure 136/92, pulse 75, temperature 98.1 F (36.7 C), temperature source Oral, resp. rate 20, height 5\' 2"  (1.575 m), weight 61.236 kg (135 lb), SpO2 100 %. Physical Exam  Constitutional: She is oriented to person, place, and time. She appears well-developed and well-nourished.  HENT:  Head: Normocephalic and  atraumatic.  Eyes: Conjunctivae and EOM are normal. Pupils are equal, round, and reactive to light.  Cardiovascular: Normal rate.   Respiratory: Effort normal.  GI: Soft.  Neurological: She is alert and oriented to person, place, and time.  Skin: Skin is warm.  Psychiatric: She has a normal mood and affect. Her behavior is normal. Judgment and thought content normal.     Assessment/Plan Plan for excision and Acell placement.  Risks and complications were discussed and she wishes to proceed.  [place] 09/20/2014, 8:10 AM

## 2014-09-20 NOTE — Op Note (Signed)
09/20/2014  9:56 AM  PATIENT:  Michaela Jefferson  74 y.o. female  PRE-OPERATIVE DIAGNOSIS:  melonoma right foort  POST-OPERATIVE DIAGNOSIS:  same  PROCEDURE:  Procedure(s): RIGHT INGUINAL SENTINEL  LYMPH NODE BIOPSY WITH BLUE DYE INJECTION  SURGEON:  Georganna Skeans, MD  ASSISTANTS: Shawn Rayburn, PAC  ANESTHESIA:   local and general  EBL:     BLOOD ADMINISTERED:none  DRAINS: none   SPECIMEN:  Excision  DISPOSITION OF SPECIMEN:  PATHOLOGY  COUNTS:  YES  DICTATION: .Dragon Dictation Michaela Jefferson presents for right inguinal sentinel LN biopsy. Wide reexcision of right heel melanoma will be done by Dr. Migdalia Dk. She was identified in the preop holding area. She received intravenous antibiotics. Informed consent was obtained. She was brought to the operating room and general anesthesia was administered by the anesthesia staff. Time out procedure was performed. Blue dye was injected cutaneously around her previous excision site in the right heel and this was massaged in place for several minutes. She had received radionucleotide injection in preop holding. Her right foot and right groin were prepped and draped in sterile fashion. Dr. Migdalia Dk proceeded with her portion which is dictated separately. Neoprobe was used and I could not find any signal detection in her right groin. We did some lymphatic pumping maneuvers and waited for. Time. There was still no signal whatsoever in her groin. There is no signal Her right knee. Decision was made to make a small incision for any blue dye stained lymph nodes. Small transverse groin incision was made. Subcutaneous tissues were dissected down through the subcutaneous fat. Neoprobe was placed in the wound as well and 0 signal was found. The probe was confirmed to be functioning well. Signal at the foot prior to that reexcision was about 300. Careful dissection was done and we found a blue lymphatic channel and traced this back to a small 6 mm lymph node with a  blue spot on it. The small lymphatic branches redundant were cauterized and it was excised and sent as cold blue node. No other blue dye stained lymphatics were palpable nodes were felt. Hemostasis was ensured. Wound was irrigated. Deep tissues were closed with running 3-0 Vicryl. Skin was closed with 4-0 Monocryl subcuticular followed by Dermabond. All counts were correct. Dr. Migdalia Dk completed her portion and the patient was taken recovery in stable condition. PATIENT DISPOSITION:  PACU - hemodynamically stable.   Delay start of Pharmacological VTE agent (>24hrs) due to surgical blood loss or risk of bleeding:  no  Georganna Skeans, MD, MPH, FACS Pager: [phone]  8/31/20169:56 AM

## 2014-09-20 NOTE — Anesthesia Preprocedure Evaluation (Signed)
Anesthesia Evaluation  Patient identified by MRN, date of birth, ID band Patient awake    Reviewed: Allergy & Precautions, NPO status , Patient's Chart, lab work & pertinent test results  Airway Mallampati: II  TM Distance: >3 FB Neck ROM: Full    Dental no notable dental hx.    Pulmonary neg pulmonary ROS, former smoker,  breath sounds clear to auscultation  Pulmonary exam normal       Cardiovascular hypertension, Normal cardiovascular examRhythm:Regular Rate:Normal     Neuro/Psych negative neurological ROS  negative psych ROS   GI/Hepatic Neg liver ROS, GERD-  Medicated,  Endo/Other  negative endocrine ROS  Renal/GU negative Renal ROS  negative genitourinary   Musculoskeletal negative musculoskeletal ROS (+)   Abdominal   Peds negative pediatric ROS (+)  Hematology negative hematology ROS (+)   Anesthesia Other Findings   Reproductive/Obstetrics negative OB ROS                             Anesthesia Physical Anesthesia Plan  ASA: II  Anesthesia Plan: General   Post-op Pain Management:    Induction: Intravenous  Airway Management Planned: LMA and Oral ETT  Additional Equipment:   Intra-op Plan:   Post-operative Plan: Extubation in OR  Informed Consent: I have reviewed the patients History and Physical, chart, labs and discussed the procedure including the risks, benefits and alternatives for the proposed anesthesia with the patient or authorized representative who has indicated his/her understanding and acceptance.   Dental advisory given  Plan Discussed with: CRNA and Surgeon  Anesthesia Plan Comments:         Anesthesia Quick Evaluation

## 2014-09-20 NOTE — Brief Op Note (Signed)
09/20/2014  9:41 AM  PATIENT:  Michaela Jefferson  74 y.o. female  PRE-OPERATIVE DIAGNOSIS:  melonoma right foort  POST-OPERATIVE DIAGNOSIS:  same  PROCEDURE:  Procedure(s): RIGHT INGUINAL SENTINEL  LYMPH NODE BIOPSY (Right) RE-EXCISION OF RIGHT HEEL MELANOMA FOR WIDER MARGINS WITH APPLICATION OF A CELL (Right)  SURGEON:  Surgeon(s) and Role: Panel 1:    * Georganna Skeans, MD - Primary  Panel 2:    * Theodoro Kos, DO - Primary  PHYSICIAN ASSISTANT: Shawn Rayburn, PA  ASSISTANTS: none   ANESTHESIA:   general  EBL:     BLOOD ADMINISTERED:none  DRAINS: none   LOCAL MEDICATIONS USED:  MARCAINE     SPECIMEN:  Source of Specimen:  right foot melanoma  DISPOSITION OF SPECIMEN:  PATHOLOGY  COUNTS:  YES  TOURNIQUET:  * No tourniquets in log *  DICTATION: .Dragon Dictation  PLAN OF CARE: Discharge to home after PACU  PATIENT DISPOSITION:  PACU - hemodynamically stable.   Delay start of Pharmacological VTE agent (>24hrs) due to surgical blood loss or risk of bleeding: no

## 2014-09-20 NOTE — Transfer of Care (Signed)
Immediate Anesthesia Transfer of Care Note  Patient: Michaela Jefferson  Procedure(s) Performed: Procedure(s): RIGHT INGUINAL SENTINEL  LYMPH NODE BIOPSY (Right) RE-EXCISION OF RIGHT HEEL MELANOMA FOR WIDER MARGINS WITH APPLICATION OF A CELL (Right)  Patient Location: PACU  Anesthesia Type:General  Level of Consciousness: awake, alert , patient cooperative and responds to stimulation  Airway & Oxygen Therapy: Patient Spontanous Breathing and Patient connected to face mask oxygen  Post-op Assessment: Report given to RN, Post -op Vital signs reviewed and stable and Patient moving all extremities X 4  Post vital signs: Reviewed and stable  Last Vitals:  Filed Vitals:   09/20/14 0702  BP: 136/92  Pulse: 75  Temp: 36.7 C  Resp: 20    Complications: No apparent anesthesia complications

## 2014-09-20 NOTE — Anesthesia Procedure Notes (Signed)
Procedure Name: Intubation Date/Time: 09/20/2014 8:41 AM Performed by: Gershon Mussel, Naysha Sholl Pre-anesthesia Checklist: Patient identified, Patient being monitored, Timeout performed, Emergency Drugs available and Suction available Patient Re-evaluated:Patient Re-evaluated prior to inductionOxygen Delivery Method: Circle System Utilized Preoxygenation: Pre-oxygenation with 100% oxygen Intubation Type: IV induction Ventilation: Mask ventilation without difficulty Laryngoscope Size: Miller and 2 Grade View: Grade I Tube type: Oral Tube size: 7.0 mm Number of attempts: 1 Airway Equipment and Method: Stylet Placement Confirmation: ETT inserted through vocal cords under direct vision,  positive ETCO2 and breath sounds checked- equal and bilateral Secured at: 21 cm Tube secured with: Tape Dental Injury: Teeth and Oropharynx as per pre-operative assessment

## 2014-09-21 ENCOUNTER — Encounter (HOSPITAL_COMMUNITY): Payer: Self-pay | Admitting: General Surgery

## 2014-12-25 ENCOUNTER — Encounter (HOSPITAL_BASED_OUTPATIENT_CLINIC_OR_DEPARTMENT_OTHER): Payer: Self-pay | Admitting: *Deleted

## 2014-12-26 ENCOUNTER — Other Ambulatory Visit: Payer: Self-pay | Admitting: Plastic Surgery

## 2014-12-26 DIAGNOSIS — L97512 Non-pressure chronic ulcer of other part of right foot with fat layer exposed: Secondary | ICD-10-CM

## 2014-12-26 DIAGNOSIS — L97412 Non-pressure chronic ulcer of right heel and midfoot with fat layer exposed: Secondary | ICD-10-CM

## 2014-12-28 ENCOUNTER — Encounter (HOSPITAL_BASED_OUTPATIENT_CLINIC_OR_DEPARTMENT_OTHER): Payer: Self-pay | Admitting: Plastic Surgery

## 2014-12-28 ENCOUNTER — Ambulatory Visit (HOSPITAL_BASED_OUTPATIENT_CLINIC_OR_DEPARTMENT_OTHER): Payer: Managed Care, Other (non HMO) | Admitting: Anesthesiology

## 2014-12-28 ENCOUNTER — Encounter (HOSPITAL_BASED_OUTPATIENT_CLINIC_OR_DEPARTMENT_OTHER)
Admission: RE | Disposition: A | Discharge status: Home / Self Care | Payer: Self-pay | Source: Ambulatory Visit | Attending: Plastic Surgery

## 2014-12-28 ENCOUNTER — Ambulatory Visit (HOSPITAL_BASED_OUTPATIENT_CLINIC_OR_DEPARTMENT_OTHER)
Admission: RE | Admit: 2014-12-28 | Discharge: 2014-12-28 | Disposition: A | Discharge status: Home / Self Care | Payer: Managed Care, Other (non HMO) | Source: Ambulatory Visit | Attending: Plastic Surgery | Admitting: Plastic Surgery

## 2014-12-28 DIAGNOSIS — L97519 Non-pressure chronic ulcer of other part of right foot with unspecified severity: Secondary | ICD-10-CM | POA: Insufficient documentation

## 2014-12-28 DIAGNOSIS — Z882 Allergy status to sulfonamides status: Secondary | ICD-10-CM | POA: Insufficient documentation

## 2014-12-28 DIAGNOSIS — E785 Hyperlipidemia, unspecified: Secondary | ICD-10-CM | POA: Diagnosis not present

## 2014-12-28 DIAGNOSIS — L97412 Non-pressure chronic ulcer of right heel and midfoot with fat layer exposed: Secondary | ICD-10-CM

## 2014-12-28 DIAGNOSIS — K219 Gastro-esophageal reflux disease without esophagitis: Secondary | ICD-10-CM | POA: Insufficient documentation

## 2014-12-28 DIAGNOSIS — Z87891 Personal history of nicotine dependence: Secondary | ICD-10-CM | POA: Insufficient documentation

## 2014-12-28 DIAGNOSIS — L97512 Non-pressure chronic ulcer of other part of right foot with fat layer exposed: Secondary | ICD-10-CM

## 2014-12-28 DIAGNOSIS — M1990 Unspecified osteoarthritis, unspecified site: Secondary | ICD-10-CM | POA: Diagnosis not present

## 2014-12-28 DIAGNOSIS — I10 Essential (primary) hypertension: Secondary | ICD-10-CM | POA: Insufficient documentation

## 2014-12-28 DIAGNOSIS — C4371 Malignant melanoma of right lower limb, including hip: Secondary | ICD-10-CM | POA: Insufficient documentation

## 2014-12-28 DIAGNOSIS — M5136 Other intervertebral disc degeneration, lumbar region: Secondary | ICD-10-CM | POA: Diagnosis not present

## 2014-12-28 HISTORY — PX: I & D EXTREMITY: SHX5045

## 2014-12-28 HISTORY — PX: APPLICATION OF A-CELL OF EXTREMITY: SHX6303

## 2014-12-28 SURGERY — IRRIGATION AND DEBRIDEMENT EXTREMITY
Anesthesia: General | Site: Foot | Laterality: Right

## 2014-12-28 MED ORDER — OXYCODONE HCL 5 MG PO TABS
ORAL_TABLET | ORAL | Status: AC
  Filled 2014-12-28: qty 1

## 2014-12-28 MED ORDER — BUPIVACAINE HCL (PF) 0.25 % IJ SOLN
INTRAMUSCULAR | Status: AC
  Filled 2014-12-28: qty 30

## 2014-12-28 MED ORDER — PROMETHAZINE HCL 25 MG/ML IJ SOLN: 6.2500 mg | INTRAMUSCULAR | Status: DC | PRN

## 2014-12-28 MED ORDER — PROPOFOL 10 MG/ML IV BOLUS
INTRAVENOUS | Status: DC | PRN
  Administered 2014-12-28: 140 mg via INTRAVENOUS

## 2014-12-28 MED ORDER — FENTANYL CITRATE (PF) 100 MCG/2ML IJ SOLN
INTRAMUSCULAR | Status: AC
  Filled 2014-12-28: qty 2

## 2014-12-28 MED ORDER — MIDAZOLAM HCL 2 MG/2ML IJ SOLN
1.0000 mg | INTRAMUSCULAR | Status: DC | PRN
  Administered 2014-12-28: 1 mg via INTRAVENOUS

## 2014-12-28 MED ORDER — BUPIVACAINE-EPINEPHRINE (PF) 0.25% -1:200000 IJ SOLN
INTRAMUSCULAR | Status: AC
  Filled 2014-12-28: qty 30

## 2014-12-28 MED ORDER — LIDOCAINE HCL (CARDIAC) 20 MG/ML IV SOLN
INTRAVENOUS | Status: DC | PRN
  Administered 2014-12-28: 80 mg via INTRAVENOUS

## 2014-12-28 MED ORDER — DEXAMETHASONE SODIUM PHOSPHATE 4 MG/ML IJ SOLN
INTRAMUSCULAR | Status: DC | PRN
  Administered 2014-12-28: 10 mg via INTRAVENOUS

## 2014-12-28 MED ORDER — CEFAZOLIN SODIUM-DEXTROSE 2-3 GM-% IV SOLR
INTRAVENOUS | Status: AC
  Filled 2014-12-28: qty 50

## 2014-12-28 MED ORDER — CEFAZOLIN SODIUM-DEXTROSE 2-3 GM-% IV SOLR
2.0000 g | INTRAVENOUS | Status: AC
  Administered 2014-12-28: 2 g via INTRAVENOUS

## 2014-12-28 MED ORDER — FENTANYL CITRATE (PF) 100 MCG/2ML IJ SOLN
50.0000 ug | INTRAMUSCULAR | Status: AC | PRN
  Administered 2014-12-28 (×2): 25 ug via INTRAVENOUS
  Administered 2014-12-28: 50 ug via INTRAVENOUS

## 2014-12-28 MED ORDER — MIDAZOLAM HCL 2 MG/2ML IJ SOLN
INTRAMUSCULAR | Status: AC
  Filled 2014-12-28: qty 2

## 2014-12-28 MED ORDER — HYDROMORPHONE HCL 1 MG/ML IJ SOLN
INTRAMUSCULAR | Status: AC
  Filled 2014-12-28: qty 1

## 2014-12-28 MED ORDER — OXYCODONE HCL 5 MG PO TABS
5.0000 mg | ORAL_TABLET | Freq: Once | ORAL | Status: AC
  Administered 2014-12-28: 5 mg via ORAL

## 2014-12-28 MED ORDER — SCOPOLAMINE 1 MG/3DAYS TD PT72: 1.0000 | MEDICATED_PATCH | Freq: Once | TRANSDERMAL | Status: DC

## 2014-12-28 MED ORDER — DEXAMETHASONE SODIUM PHOSPHATE 10 MG/ML IJ SOLN
INTRAMUSCULAR | Status: AC
  Filled 2014-12-28: qty 1

## 2014-12-28 MED ORDER — HYDROMORPHONE HCL 1 MG/ML IJ SOLN
0.2500 mg | INTRAMUSCULAR | Status: DC | PRN
  Administered 2014-12-28 (×4): 0.25 mg via INTRAVENOUS

## 2014-12-28 MED ORDER — LACTATED RINGERS IV SOLN
INTRAVENOUS | Status: DC
  Administered 2014-12-28: 14:00:00 via INTRAVENOUS

## 2014-12-28 MED ORDER — SCOPOLAMINE 1 MG/3DAYS TD PT72SCOPOLAMINE 1 MG/3DAYS
1.0000 | MEDICATED_PATCH | TRANSDERMAL | Status: DC
Start: 2014-12-28 — End: 2014-12-28

## 2014-12-28 MED ORDER — ONDANSETRON HCL 4 MG/2ML IJ SOLN
INTRAMUSCULAR | Status: DC | PRN
  Administered 2014-12-28: 4 mg via INTRAVENOUS

## 2014-12-28 MED ORDER — ONDANSETRON HCL 4 MG/2ML IJ SOLN
INTRAMUSCULAR | Status: AC
  Filled 2014-12-28: qty 2

## 2014-12-28 MED ORDER — SODIUM CHLORIDE 0.9 % IR SOLN
Status: DC | PRN
  Administered 2014-12-28: 500 mL

## 2014-12-28 MED ORDER — HYDROCODONE-ACETAMINOPHEN 5-325 MG PO TABS: 1.0000 | ORAL_TABLET | Freq: Four times a day (QID) | ORAL | Status: DC | PRN

## 2014-12-28 MED ORDER — LIDOCAINE HCL (PF) 1 % IJ SOLN
INTRAMUSCULAR | Status: AC
  Filled 2014-12-28: qty 30

## 2014-12-28 MED ORDER — BACITRACIN-NEOMYCIN-POLYMYXIN 400-5-5000 EX OINT
TOPICAL_OINTMENT | CUTANEOUS | Status: AC
  Filled 2014-12-28: qty 1

## 2014-12-28 MED ORDER — GLYCOPYRROLATE 0.2 MG/ML IJ SOLN: 0.2000 mg | Freq: Once | INTRAMUSCULAR | Status: DC | PRN

## 2014-12-28 SURGICAL SUPPLY — 70 items
BAG DECANTER FOR FLEXI CONT (MISCELLANEOUS) ×3 IMPLANT
BANDAGE ELASTIC 3 VELCRO ST LF (GAUZE/BANDAGES/DRESSINGS) IMPLANT
BANDAGE ELASTIC 4 VELCRO ST LF (GAUZE/BANDAGES/DRESSINGS) ×3 IMPLANT
BANDAGE ELASTIC 6 VELCRO ST LF (GAUZE/BANDAGES/DRESSINGS) IMPLANT
BLADE HEX COATED 2.75 (ELECTRODE) IMPLANT
BLADE SURG 10 STRL SS (BLADE) IMPLANT
BLADE SURG 15 STRL LF DISP TIS (BLADE) ×1 IMPLANT
BLADE SURG 15 STRL SS (BLADE) ×2
BNDG COHESIVE 4X5 TAN STRL (GAUZE/BANDAGES/DRESSINGS) IMPLANT
BNDG CONFORM 2 STRL LF (GAUZE/BANDAGES/DRESSINGS) IMPLANT
BNDG CONFORM 3 STRL LF (GAUZE/BANDAGES/DRESSINGS) IMPLANT
BNDG GAUZE ELAST 4 BULKY (GAUZE/BANDAGES/DRESSINGS) ×3 IMPLANT
CANISTER SUCT 1200ML W/VALVE (MISCELLANEOUS) IMPLANT
CHLORAPREP W/TINT 26ML (MISCELLANEOUS) IMPLANT
CLOSURE WOUND 1/2 X4 (GAUZE/BANDAGES/DRESSINGS)
COVER BACK TABLE 60X90IN (DRAPES) ×3 IMPLANT
DECANTER SPIKE VIAL GLASS SM (MISCELLANEOUS) IMPLANT
DRAPE INCISE IOBAN 66X45 STRL (DRAPES) IMPLANT
DRAPE U-SHAPE 76X120 STRL (DRAPES) ×3 IMPLANT
DRESSING DUODERM 4X4 STERILE (GAUZE/BANDAGES/DRESSINGS) ×3 IMPLANT
DRSG ADAPTIC 3X8 NADH LF (GAUZE/BANDAGES/DRESSINGS) IMPLANT
DRSG EMULSION OIL 3X3 NADH (GAUZE/BANDAGES/DRESSINGS) ×3 IMPLANT
DRSG PAD ABDOMINAL 8X10 ST (GAUZE/BANDAGES/DRESSINGS) IMPLANT
ELECT REM PT RETURN 9FT ADLT (ELECTROSURGICAL) ×3
ELECTRODE REM PT RTRN 9FT ADLT (ELECTROSURGICAL) ×1 IMPLANT
GAUZE SPONGE 4X4 12PLY STRL (GAUZE/BANDAGES/DRESSINGS) ×3 IMPLANT
GAUZE XEROFORM 1X8 LF (GAUZE/BANDAGES/DRESSINGS) IMPLANT
GAUZE XEROFORM 5X9 LF (GAUZE/BANDAGES/DRESSINGS) IMPLANT
GLOVE BIO SURGEON STRL SZ 6.5 (GLOVE) ×8 IMPLANT
GLOVE BIO SURGEON STRL SZ8 (GLOVE) IMPLANT
GLOVE BIO SURGEONS STRL SZ 6.5 (GLOVE) ×4
GOWN STRL REUS W/ TWL LRG LVL3 (GOWN DISPOSABLE) ×2 IMPLANT
GOWN STRL REUS W/TWL LRG LVL3 (GOWN DISPOSABLE) ×4
MANIFOLD NEPTUNE II (INSTRUMENTS) IMPLANT
MATRIX SURGICAL PSM 5X5CM (Tissue) ×3 IMPLANT
MICROMATRIX 1000MG (Tissue) ×3 IMPLANT
NEEDLE HYPO 25X1 1.5 SAFETY (NEEDLE) IMPLANT
NS IRRIG 1000ML POUR BTL (IV SOLUTION) ×3 IMPLANT
PACK BASIN DAY SURGERY FS (CUSTOM PROCEDURE TRAY) ×3 IMPLANT
PADDING CAST ABS 3INX4YD NS (CAST SUPPLIES)
PADDING CAST ABS 4INX4YD NS (CAST SUPPLIES)
PADDING CAST ABS COTTON 3X4 (CAST SUPPLIES) IMPLANT
PADDING CAST ABS COTTON 4X4 ST (CAST SUPPLIES) IMPLANT
PENCIL BUTTON HOLSTER BLD 10FT (ELECTRODE) IMPLANT
SHEET MEDIUM DRAPE 40X70 STRL (DRAPES) ×3 IMPLANT
SLEEVE SCD COMPRESS KNEE MED (MISCELLANEOUS) ×3 IMPLANT
SOLUTION PARTIC MCRMTRX 1000MG (Tissue) ×1 IMPLANT
SPLINT FIBERGLASS 3X35 (CAST SUPPLIES) ×3 IMPLANT
SPLINT FIBERGLASS 4X30 (CAST SUPPLIES) IMPLANT
SPLINT PLASTER CAST XFAST 3X15 (CAST SUPPLIES) IMPLANT
SPLINT PLASTER XTRA FASTSET 3X (CAST SUPPLIES)
SPONGE GAUZE 4X4 12PLY STER LF (GAUZE/BANDAGES/DRESSINGS) IMPLANT
SPONGE LAP 18X18 X RAY DECT (DISPOSABLE) ×3 IMPLANT
STAPLER VISISTAT 35W (STAPLE) IMPLANT
STOCKINETTE IMPERVIOUS LG (DRAPES) IMPLANT
STRIP CLOSURE SKIN 1/2X4 (GAUZE/BANDAGES/DRESSINGS) IMPLANT
SURGILUBE 2OZ TUBE FLIPTOP (MISCELLANEOUS) IMPLANT
SUT SILK 3 0 PS 1 (SUTURE) ×3 IMPLANT
SUT SILK 4 0 PS 2 (SUTURE) IMPLANT
SUT VIC AB 5-0 PS2 18 (SUTURE) ×3 IMPLANT
SYR BULB IRRIGATION 50ML (SYRINGE) ×3 IMPLANT
SYR CONTROL 10ML LL (SYRINGE) IMPLANT
TAPE HYPAFIX 6 X30' (GAUZE/BANDAGES/DRESSINGS)
TAPE HYPAFIX 6X30 (GAUZE/BANDAGES/DRESSINGS) IMPLANT
TOWEL OR 17X24 6PK STRL BLUE (TOWEL DISPOSABLE) ×3 IMPLANT
TRAY DSU PREP LF (CUSTOM PROCEDURE TRAY) ×3 IMPLANT
TUBE CONNECTING 20'X1/4 (TUBING) ×1
TUBE CONNECTING 20X1/4 (TUBING) ×2 IMPLANT
UNDERPAD 30X30 (UNDERPADS AND DIAPERS) ×3 IMPLANT
YANKAUER SUCT BULB TIP NO VENT (SUCTIONS) ×3 IMPLANT

## 2014-12-28 NOTE — Anesthesia Procedure Notes (Signed)
Procedure Name: LMA Insertion Date/Time: 12/28/2014 2:12 PM Performed by: Maryella Shivers Pre-anesthesia Checklist: Patient identified, Emergency Drugs available, Suction available and Patient being monitored Patient Re-evaluated:Patient Re-evaluated prior to inductionOxygen Delivery Method: Circle System Utilized Preoxygenation: Pre-oxygenation with 100% oxygen Intubation Type: IV induction Ventilation: Mask ventilation without difficulty LMA: LMA inserted LMA Size: 4.0 Number of attempts: 1 Airway Equipment and Method: Bite block Placement Confirmation: positive ETCO2 Tube secured with: Tape Dental Injury: Teeth and Oropharynx as per pre-operative assessment

## 2014-12-28 NOTE — Op Note (Addendum)
Operative Note   DATE OF OPERATION: 12/28/2014  LOCATION: [place]  SURGICAL DIVISION: Plastic Surgery  PREOPERATIVE DIAGNOSES:  Right foot ulcer secondary to excision of melanoma 5 x 5 cm (margins negative)  POSTOPERATIVE DIAGNOSES:  same  PROCEDURE:  Preparation of right heel for placement of Acell (sheet 5 x 5 cm and 1 gm powder)  SURGEON: Theodoro Kos, DO  ASSISTANT: Shawn Rayburn, PA  ANESTHESIA:  General.   COMPLICATIONS: None.   INDICATIONS FOR PROCEDURE:  The patient, Michaela Jefferson is a 74 y.o. female born on [birth year], is here for treatment of  MRN: [id number]  CONSENT:  Informed consent was obtained directly from the patient. Risks, benefits and alternatives were fully discussed. Specific risks including but not limited to bleeding, infection, hematoma, seroma, scarring, pain, infection, contracture, asymmetry, wound healing problems, and need for further surgery were all discussed. The patient did have an ample opportunity to have questions answered to satisfaction.   DESCRIPTION OF PROCEDURE:  The patient was taken to the operating room. SCDs were placed and IV antibiotics were given. The patient's operative site was prepped and draped in a sterile fashion. A time out was performed and all information was confirmed to be correct.  General anesthesia was administered.  The #10 blade was used to excise the hypergranulation tissue. Hemostasis was achieved with electrocautery.  All of the Acell powder and sheet were applied and secured with 5-0 Vicryl.  The adaptic was sutured on with 4-0 Silk.  Surgical lube and gauze were used with an ACE wrap for the dressing.  The patient tolerated the procedure well.  There were no complications. The patient was allowed to wake from anesthesia, extubated and taken to the recovery room in satisfactory condition.

## 2014-12-28 NOTE — Transfer of Care (Signed)
Immediate Anesthesia Transfer of Care Note  Patient: Michaela Jefferson  Procedure(s) Performed: Procedure(s): DEBRIDEMENT OF RIGHT FOOT AND HEEL  (Right) PLACEMENT OF A-CELL (Right)  Patient Location: PACU  Anesthesia Type:General  Level of Consciousness: sedated  Airway & Oxygen Therapy: Patient Spontanous Breathing and Patient connected to face mask oxygen  Post-op Assessment: Report given to RN and Post -op Vital signs reviewed and stable  Post vital signs: Reviewed and stable  Last Vitals:  Filed Vitals:   12/28/14 1346 12/28/14 1452  BP: 137/79 153/91  Pulse: 67 69  Temp: 36.9 C 36.3 C  Resp: 18 15    Complications: No apparent anesthesia complications

## 2014-12-28 NOTE — Brief Op Note (Signed)
12/28/2014  2:53 PM  PATIENT:  Michaela Jefferson  74 y.o. female  PRE-OPERATIVE DIAGNOSIS:  RIGHT FOOT AND HEEL WOUND  POST-OPERATIVE DIAGNOSIS:  RIGHT FOOT AND HEEL WOUND  PROCEDURE:  Procedure(s): DEBRIDEMENT OF RIGHT FOOT AND HEEL  (Right) PLACEMENT OF A-CELL (Right)  SURGEON:  Surgeon(s) and Role:    * Loel Lofty Findley Vi, DO - Primary  PHYSICIAN ASSISTANT: Shawn Rayburn, PA  ASSISTANTS: none   ANESTHESIA:   none  EBL:  Total I/O In: 200 [I.V.:200] Out: -   BLOOD ADMINISTERED:none  DRAINS: none   LOCAL MEDICATIONS USED:  NONE  SPECIMEN:  No Specimen  DISPOSITION OF SPECIMEN:  N/A  COUNTS:  YES  TOURNIQUET:    DICTATION: .Dragon Dictation  PLAN OF CARE: Discharge to home after PACU  PATIENT DISPOSITION:  PACU - hemodynamically stable.   Delay start of Pharmacological VTE agent (>24hrs) due to surgical blood loss or risk of bleeding: no

## 2014-12-28 NOTE — Anesthesia Postprocedure Evaluation (Signed)
Anesthesia Post Note  Patient: Michaela Jefferson  Procedure(s) Performed: Procedure(s) (LRB): DEBRIDEMENT OF RIGHT FOOT AND HEEL  (Right) PLACEMENT OF A-CELL (Right)  Patient location during evaluation: PACU Anesthesia Type: General Level of consciousness: awake and alert Pain management: pain level controlled Vital Signs Assessment: post-procedure vital signs reviewed and stable Respiratory status: spontaneous breathing, nonlabored ventilation, respiratory function stable and patient connected to nasal cannula oxygen Cardiovascular status: blood pressure returned to baseline and stable Postop Assessment: no signs of nausea or vomiting Anesthetic complications: no    Last Vitals:  Filed Vitals:   12/28/14 1452 12/28/14 1500  BP: 153/91   Pulse: 69 77  Temp: 36.3 C   Resp: 15 10    Last Pain:  Filed Vitals:   12/28/14 1501  PainSc: 0-No pain                 Shepard Keltz JENNETTE

## 2014-12-28 NOTE — H&P (Signed)
Michaela Jefferson is an 74 y.o. female.   Chief Complaint: wound on right foot HPI: The patient is a 74 yrs old bf here for further treatment of her right foot.  She was diagnosed with melanoma.  A wide excision and SLN dissection was performed.  Her margins were negative and Acell was placed.  She did very well and healed the area.  She had an area of breakdown and presents for further intervention.   Past Medical History  Diagnosis Date  . DDD (degenerative disc disease), lumbar   . Hyperlipidemia   . DJD (degenerative joint disease)   . Melanoma ([place]) 2016    right heel  . Hypertension     pt. denies any htn,states never taken any meds for it.  . Pneumonia 2013  . GERD (gastroesophageal reflux disease)     Past Surgical History  Procedure Laterality Date  . Abdominal hysterectomy    . Cholecystectomy    . Mass excision Right 07/26/2014    Procedure: EXCISION OF RIGHT HEEL MELANOMA ;  Surgeon: Theodoro Kos, DO;  Location: [place] [facility];  Service: Plastics;  Laterality: Right;  . Application of a-cell of extremity Right 07/26/2014    Procedure: WITH PLACEMENT OF A CELL TO RIGHT HEEL;  Surgeon: Theodoro Kos, DO;  Location: [place];  Service: Plastics;  Laterality: Right;  . Tonsillectomy    . Inguinal lymph node biopsy Right 09/20/2014    Procedure: RIGHT INGUINAL SENTINEL  LYMPH NODE BIOPSY;  Surgeon: Georganna Skeans, MD;  Location: [place];  Service: General;  Laterality: Right;  . Mass excision Right 09/20/2014    Procedure: RE-EXCISION OF RIGHT HEEL MELANOMA FOR WIDER MARGINS WITH APPLICATION OF A CELL;  Surgeon: Theodoro Kos, DO;  Location: [facility];  Service: Plastics;  Laterality: Right;    History reviewed. No pertinent family history. Social History:  reports that she quit smoking about 18 years ago. She has never used smokeless tobacco. She reports that she drinks alcohol. She reports that she does not use illicit drugs.  Allergies:  Allergies   Allergen Reactions  . Sulfa Antibiotics Anaphylaxis    Has not taken since childhood  . Sulfonamide Derivatives Anaphylaxis    Has not taken since childhood    No prescriptions prior to admission    No results found for this or any previous visit (from the past 14 hour(s)). No results found.  Review of Systems  Constitutional: Negative.   HENT: Negative.   Eyes: Negative.   Respiratory: Negative.   Cardiovascular: Negative.   Gastrointestinal: Negative.   Genitourinary: Negative.   Musculoskeletal: Negative.   Skin: Negative.   Neurological: Negative.   Psychiatric/Behavioral: Negative.     Height 5\' 2"  (1.575 m), weight 62.596 kg (138 lb). Physical Exam  Constitutional: She is oriented to person, place, and time. She appears well-developed and well-nourished.  HENT:  Head: Normocephalic and atraumatic.  Eyes: Conjunctivae and EOM are normal. Pupils are equal, round, and reactive to light.  Cardiovascular: Normal rate.   Respiratory: Effort normal.  GI: Soft.  Musculoskeletal: Normal range of motion.  Neurological: She is alert and oriented to person, place, and time.  Skin: Skin is warm.  Psychiatric: She has a normal mood and affect. Her behavior is normal. Judgment and thought content normal.     Assessment/Plan The decision was made to apply more Acell and possible a Skin graft for closure.  Wallace Going 12/28/2014, 9:24 AM

## 2014-12-28 NOTE — Discharge Instructions (Signed)
Apply KY gel daily starting on Saturday Keep off foot as much as possible   Post Anesthesia Home Care Instructions  Activity: Get plenty of rest for the remainder of the day. A responsible adult should stay with you for 24 hours following the procedure.  For the next 24 hours, DO NOT: -Drive a car -Paediatric nurse -Drink alcoholic beverages -Take any medication unless instructed by your physician -Make any legal decisions or sign important papers.  Meals: Start with liquid foods such as gelatin or soup. Progress to regular foods as tolerated. Avoid greasy, spicy, heavy foods. If nausea and/or vomiting occur, drink only clear liquids until the nausea and/or vomiting subsides. Call your physician if vomiting continues.  Special Instructions/Symptoms: Your throat may feel dry or sore from the anesthesia or the breathing tube placed in your throat during surgery. If this causes discomfort, gargle with warm salt water. The discomfort should disappear within 24 hours.  If you had a scopolamine patch placed behind your ear for the management of post- operative nausea and/or vomiting:  1. The medication in the patch is effective for 72 hours, after which it should be removed.  Wrap patch in a tissue and discard in the trash. Wash hands thoroughly with soap and water. 2. You may remove the patch earlier than 72 hours if you experience unpleasant side effects which may include dry mouth, dizziness or visual disturbances. 3. Avoid touching the patch. Wash your hands with soap and water after contact with the patch.

## 2014-12-28 NOTE — Anesthesia Preprocedure Evaluation (Addendum)
Anesthesia Evaluation    Reviewed: Allergy & Precautions, Patient's Chart, lab work & pertinent test results  History of Anesthesia Complications Negative for: history of anesthetic complications  Airway Mallampati: II  TM Distance: >3 FB Neck ROM: Full    Dental  (+) Poor Dentition, Chipped   Pulmonary former smoker,    breath sounds clear to auscultation       Cardiovascular hypertension, Pt. on medications Normal cardiovascular exam     Neuro/Psych negative neurological ROS  negative psych ROS   GI/Hepatic Neg liver ROS, GERD  Medicated,  Endo/Other  negative endocrine ROS  Renal/GU negative Renal ROS  negative genitourinary   Musculoskeletal  (+) Arthritis , Osteoarthritis,    Abdominal   Peds negative pediatric ROS (+)  Hematology negative hematology ROS (+)   Anesthesia Other Findings   Reproductive/Obstetrics negative OB ROS                           Anesthesia Physical  Anesthesia Plan  ASA: II  Anesthesia Plan: General   Post-op Pain Management:    Induction: Intravenous  Airway Management Planned: LMA  Additional Equipment:   Intra-op Plan:   Post-operative Plan: Extubation in OR  Informed Consent: I have reviewed the patients History and Physical, chart, labs and discussed the procedure including the risks, benefits and alternatives for the proposed anesthesia with the patient or authorized representative who has indicated his/her understanding and acceptance.   Dental advisory given  Plan Discussed with: CRNA and Surgeon  Anesthesia Plan Comments:        Anesthesia Quick Evaluation

## 2014-12-29 ENCOUNTER — Encounter (HOSPITAL_BASED_OUTPATIENT_CLINIC_OR_DEPARTMENT_OTHER): Payer: Self-pay | Admitting: Plastic Surgery

## 2016-01-18 ENCOUNTER — Ambulatory Visit: Payer: Self-pay | Admitting: General Surgery

## 2016-02-08 ENCOUNTER — Encounter (HOSPITAL_BASED_OUTPATIENT_CLINIC_OR_DEPARTMENT_OTHER): Payer: Self-pay

## 2016-02-08 ENCOUNTER — Encounter (HOSPITAL_BASED_OUTPATIENT_CLINIC_OR_DEPARTMENT_OTHER): Payer: Self-pay | Admitting: *Deleted

## 2016-02-08 ENCOUNTER — Emergency Department (HOSPITAL_BASED_OUTPATIENT_CLINIC_OR_DEPARTMENT_OTHER)
Admission: EM | Admit: 2016-02-08 | Discharge: 2016-02-08 | Disposition: A | Discharge status: Home / Self Care | Payer: Managed Care, Other (non HMO) | Attending: Dermatology | Admitting: Dermatology

## 2016-02-08 DIAGNOSIS — R1110 Vomiting, unspecified: Secondary | ICD-10-CM | POA: Diagnosis present

## 2016-02-08 DIAGNOSIS — Z87891 Personal history of nicotine dependence: Secondary | ICD-10-CM | POA: Insufficient documentation

## 2016-02-08 DIAGNOSIS — Z5321 Procedure and treatment not carried out due to patient leaving prior to being seen by health care provider: Secondary | ICD-10-CM | POA: Diagnosis not present

## 2016-02-08 NOTE — ED Triage Notes (Signed)
Pt c/o felt weak, vomited x 1 while at work today-denies pain-no CP,no HA-states "i feel normal now"-reports hx of need for hip replacement-walking with slow limping gait/own cane

## 2016-02-08 NOTE — ED Notes (Signed)
EKG completed in triage, pt unable to sit up in chair...unable to obtain without artifact due to pt's position, related to her chronic pain. EKG to Dr Oleta Mouse. Advised to repeat EKG once pt able to lay in bed.

## 2016-02-08 NOTE — ED Notes (Signed)
No answer when called for tx area 

## 2016-02-08 NOTE — ED Triage Notes (Signed)
Pt attempted to sit in w/c-states hip pain worse seated in w/c-prefers to continue to walk with own cane

## 2016-02-13 ENCOUNTER — Ambulatory Visit (HOSPITAL_BASED_OUTPATIENT_CLINIC_OR_DEPARTMENT_OTHER)
Admission: RE | Admit: 2016-02-13 | Discharge: 2016-02-13 | Disposition: A | Discharge status: Home / Self Care | Payer: Managed Care, Other (non HMO) | Source: Ambulatory Visit | Attending: General Surgery | Admitting: General Surgery

## 2016-02-13 ENCOUNTER — Encounter (HOSPITAL_BASED_OUTPATIENT_CLINIC_OR_DEPARTMENT_OTHER): Payer: Self-pay | Admitting: Certified Registered"

## 2016-02-13 ENCOUNTER — Ambulatory Visit (HOSPITAL_BASED_OUTPATIENT_CLINIC_OR_DEPARTMENT_OTHER): Payer: Managed Care, Other (non HMO) | Admitting: Certified Registered"

## 2016-02-13 ENCOUNTER — Encounter (HOSPITAL_BASED_OUTPATIENT_CLINIC_OR_DEPARTMENT_OTHER)
Admission: RE | Disposition: A | Discharge status: Home / Self Care | Payer: Self-pay | Source: Ambulatory Visit | Attending: General Surgery

## 2016-02-13 DIAGNOSIS — K219 Gastro-esophageal reflux disease without esophagitis: Secondary | ICD-10-CM | POA: Diagnosis not present

## 2016-02-13 DIAGNOSIS — C774 Secondary and unspecified malignant neoplasm of inguinal and lower limb lymph nodes: Secondary | ICD-10-CM | POA: Diagnosis not present

## 2016-02-13 DIAGNOSIS — Z87891 Personal history of nicotine dependence: Secondary | ICD-10-CM | POA: Diagnosis not present

## 2016-02-13 DIAGNOSIS — E785 Hyperlipidemia, unspecified: Secondary | ICD-10-CM | POA: Insufficient documentation

## 2016-02-13 DIAGNOSIS — Z85820 Personal history of malignant melanoma of skin: Secondary | ICD-10-CM | POA: Insufficient documentation

## 2016-02-13 DIAGNOSIS — R590 Localized enlarged lymph nodes: Secondary | ICD-10-CM | POA: Diagnosis present

## 2016-02-13 HISTORY — PX: INGUINAL LYMPH NODE BIOPSY: SHX5865

## 2016-02-13 HISTORY — DX: Gastric ulcer, unspecified as acute or chronic, without hemorrhage or perforation: K25.9

## 2016-02-13 SURGERY — BIOPSY, LYMPH NODE, INGUINAL, OPEN
Anesthesia: General | Laterality: Right

## 2016-02-13 MED ORDER — BUPIVACAINE-EPINEPHRINE 0.5% -1:200000 IJ SOLN
INTRAMUSCULAR | Status: DC | PRN
  Administered 2016-02-13: 10 mL

## 2016-02-13 MED ORDER — DEXAMETHASONE SODIUM PHOSPHATE 4 MG/ML IJ SOLN
INTRAMUSCULAR | Status: DC | PRN
  Administered 2016-02-13: 4 mg via INTRAVENOUS

## 2016-02-13 MED ORDER — SCOPOLAMINE 1 MG/3DAYS TD PT72: 1.0000 | MEDICATED_PATCH | Freq: Once | TRANSDERMAL | Status: DC | PRN

## 2016-02-13 MED ORDER — ONDANSETRON HCL 4 MG/2ML IJ SOLN
INTRAMUSCULAR | Status: DC | PRN
  Administered 2016-02-13: 4 mg via INTRAVENOUS

## 2016-02-13 MED ORDER — LIDOCAINE 2% (20 MG/ML) 5 ML SYRINGE
INTRAMUSCULAR | Status: AC
  Filled 2016-02-13: qty 5

## 2016-02-13 MED ORDER — FENTANYL CITRATE (PF) 100 MCG/2ML IJ SOLN
50.0000 ug | INTRAMUSCULAR | Status: AC | PRN
  Administered 2016-02-13 (×2): 25 ug via INTRAVENOUS
  Administered 2016-02-13: 50 ug via INTRAVENOUS

## 2016-02-13 MED ORDER — DEXAMETHASONE SODIUM PHOSPHATE 10 MG/ML IJ SOLN
INTRAMUSCULAR | Status: AC
  Filled 2016-02-13: qty 1

## 2016-02-13 MED ORDER — CHLORHEXIDINE GLUCONATE CLOTH 2 % EX PADS: 6.0000 | MEDICATED_PAD | Freq: Once | CUTANEOUS | Status: DC

## 2016-02-13 MED ORDER — MEPERIDINE HCL 25 MG/ML IJ SOLN: 6.2500 mg | INTRAMUSCULAR | Status: DC | PRN

## 2016-02-13 MED ORDER — CEFAZOLIN SODIUM-DEXTROSE 2-4 GM/100ML-% IV SOLN
2.0000 g | INTRAVENOUS | Status: AC
  Administered 2016-02-13: 2 g via INTRAVENOUS

## 2016-02-13 MED ORDER — METOCLOPRAMIDE HCL 5 MG/ML IJ SOLN: 10.0000 mg | Freq: Once | INTRAMUSCULAR | Status: DC | PRN

## 2016-02-13 MED ORDER — ONDANSETRON HCL 4 MG/2ML IJ SOLN
INTRAMUSCULAR | Status: AC
  Filled 2016-02-13: qty 2

## 2016-02-13 MED ORDER — FENTANYL CITRATE (PF) 100 MCG/2ML IJ SOLN
INTRAMUSCULAR | Status: AC
Start: 2016-02-13 — End: 2016-02-13
  Filled 2016-02-13: qty 2

## 2016-02-13 MED ORDER — MIDAZOLAM HCL 2 MG/2ML IJ SOLN: 1.0000 mg | INTRAMUSCULAR | Status: DC | PRN

## 2016-02-13 MED ORDER — HYDROCODONE-ACETAMINOPHEN 5-325 MG PO TABS: 1.0000 | ORAL_TABLET | ORAL | 0 refills | Status: DC | PRN

## 2016-02-13 MED ORDER — PROPOFOL 10 MG/ML IV BOLUS
INTRAVENOUS | Status: AC
  Filled 2016-02-13: qty 20

## 2016-02-13 MED ORDER — FENTANYL CITRATE (PF) 100 MCG/2ML IJ SOLN
INTRAMUSCULAR | Status: AC
  Filled 2016-02-13: qty 2

## 2016-02-13 MED ORDER — LACTATED RINGERS IV SOLN
INTRAVENOUS | Status: DC
  Administered 2016-02-13: 12:00:00 via INTRAVENOUS

## 2016-02-13 MED ORDER — PROPOFOL 10 MG/ML IV BOLUS
INTRAVENOUS | Status: DC | PRN
  Administered 2016-02-13: 140 mg via INTRAVENOUS

## 2016-02-13 MED ORDER — FENTANYL CITRATE (PF) 100 MCG/2ML IJ SOLN
25.0000 ug | INTRAMUSCULAR | Status: DC | PRN
  Administered 2016-02-13 (×4): 25 ug via INTRAVENOUS

## 2016-02-13 MED ORDER — CHLORHEXIDINE GLUCONATE CLOTH 2 % EX PADS
6.0000 | MEDICATED_PAD | Freq: Once | CUTANEOUS | Status: DC
Start: 2016-02-13 — End: 2016-02-13

## 2016-02-13 MED ORDER — LIDOCAINE 2% (20 MG/ML) 5 ML SYRINGE
INTRAMUSCULAR | Status: DC | PRN
  Administered 2016-02-13: 80 mg via INTRAVENOUS

## 2016-02-13 MED ORDER — CEFAZOLIN SODIUM-DEXTROSE 2-4 GM/100ML-% IV SOLN
INTRAVENOUS | Status: AC
  Filled 2016-02-13: qty 100

## 2016-02-13 SURGICAL SUPPLY — 47 items
APPLIER CLIP 9.375 MED OPEN (MISCELLANEOUS) ×3
BLADE CLIPPER SURG (BLADE) ×3 IMPLANT
BLADE HEX COATED 2.75 (ELECTRODE) ×3 IMPLANT
BLADE SURG 10 STRL SS (BLADE) ×3 IMPLANT
BLADE SURG 15 STRL LF DISP TIS (BLADE) ×1 IMPLANT
BLADE SURG 15 STRL SS (BLADE) ×2
CANISTER SUCT 1200ML W/VALVE (MISCELLANEOUS) ×3 IMPLANT
CHLORAPREP W/TINT 26ML (MISCELLANEOUS) ×3 IMPLANT
CLIP APPLIE 9.375 MED OPEN (MISCELLANEOUS) ×1 IMPLANT
COVER BACK TABLE 60X90IN (DRAPES) ×3 IMPLANT
COVER MAYO STAND STRL (DRAPES) ×3 IMPLANT
DECANTER SPIKE VIAL GLASS SM (MISCELLANEOUS) IMPLANT
DERMABOND ADVANCED (GAUZE/BANDAGES/DRESSINGS) ×2
DERMABOND ADVANCED .7 DNX12 (GAUZE/BANDAGES/DRESSINGS) ×1 IMPLANT
DRAIN PENROSE 1/2X12 LTX STRL (WOUND CARE) IMPLANT
DRAPE LAPAROTOMY 100X72 PEDS (DRAPES) ×3 IMPLANT
DRAPE UTILITY XL STRL (DRAPES) ×3 IMPLANT
ELECT REM PT RETURN 9FT ADLT (ELECTROSURGICAL) ×3
ELECTRODE REM PT RTRN 9FT ADLT (ELECTROSURGICAL) ×1 IMPLANT
GLOVE BIO SURGEON STRL SZ8 (GLOVE) ×3 IMPLANT
GLOVE BIOGEL PI IND STRL 8 (GLOVE) ×1 IMPLANT
GLOVE BIOGEL PI INDICATOR 8 (GLOVE) ×2
GOWN STRL REUS W/ TWL LRG LVL3 (GOWN DISPOSABLE) ×1 IMPLANT
GOWN STRL REUS W/ TWL XL LVL3 (GOWN DISPOSABLE) ×1 IMPLANT
GOWN STRL REUS W/TWL LRG LVL3 (GOWN DISPOSABLE) ×2
GOWN STRL REUS W/TWL XL LVL3 (GOWN DISPOSABLE) ×2
NEEDLE HYPO 25X1 1.5 SAFETY (NEEDLE) ×3 IMPLANT
NS IRRIG 1000ML POUR BTL (IV SOLUTION) ×3 IMPLANT
PACK BASIN DAY SURGERY FS (CUSTOM PROCEDURE TRAY) ×3 IMPLANT
PENCIL BUTTON HOLSTER BLD 10FT (ELECTRODE) ×3 IMPLANT
SLEEVE SCD COMPRESS KNEE MED (MISCELLANEOUS) ×3 IMPLANT
SPONGE LAP 18X18 X RAY DECT (DISPOSABLE) IMPLANT
SPONGE LAP 4X18 X RAY DECT (DISPOSABLE) ×6 IMPLANT
SUT MON AB 4-0 PC3 18 (SUTURE) ×3 IMPLANT
SUT VIC AB 2-0 SH 27 (SUTURE) ×2
SUT VIC AB 2-0 SH 27XBRD (SUTURE) ×1 IMPLANT
SUT VIC AB 3-0 SH 27 (SUTURE) ×4
SUT VIC AB 3-0 SH 27X BRD (SUTURE) ×2 IMPLANT
SUT VICRYL AB 2 0 TIE (SUTURE) IMPLANT
SUT VICRYL AB 2 0 TIES (SUTURE)
SYR BULB 3OZ (MISCELLANEOUS) ×3 IMPLANT
SYR CONTROL 10ML LL (SYRINGE) ×3 IMPLANT
TOWEL OR 17X24 6PK STRL BLUE (TOWEL DISPOSABLE) ×6 IMPLANT
TOWEL OR NON WOVEN STRL DISP B (DISPOSABLE) ×3 IMPLANT
TUBE CONNECTING 20'X1/4 (TUBING) ×1
TUBE CONNECTING 20X1/4 (TUBING) ×2 IMPLANT
YANKAUER SUCT BULB TIP NO VENT (SUCTIONS) ×3 IMPLANT

## 2016-02-13 NOTE — Op Note (Signed)
02/13/2016  1:21 PM  PATIENT:  Michaela Jefferson  76 y.o. female  PRE-OPERATIVE DIAGNOSIS:  right inguinal lymphadenopathy  POST-OPERATIVE DIAGNOSIS:  right inguinal lymphadenopathy  PROCEDURE:  Procedure(s): RIGHT INGUINAL LYMPH NODE BIOPSY  SURGEON:  Surgeon(s): Georganna Skeans, MD  ASSISTANTS: none   ANESTHESIA:   local and general  EBL:  Total I/O In: 700 [I.V.:700] Out: 30 [Blood:30]  BLOOD ADMINISTERED:none  DRAINS: none   SPECIMEN:  Excision  DISPOSITION OF SPECIMEN:  PATHOLOGY  COUNTS:  YES  DICTATION: .Dragon Dictation Findings: 3cm necrotic lymph node  [name] presents for right inguinal lymph node biopsy. She was identified in the preop holding area. Informed consent was obtained. Her site was marked. She received intravenous antibiotics. She was brought to the operating room and general anesthesia with laryngeal mask airway was administered by the anesthesia staff. Her right inguinal region was prepped and draped in a sterile fashion. We did time out procedure. Transverse incision was made over the palpable lymph node. Subcutaneous tissues were dissected down using cautery. The lymph node was circumferentially dissected in a careful fashion. Small clips were used on the small vessels and lymphatics going to the lymph node. Lymph node was removed and sent to pathology. Hemostasis was obtained with cautery and a small vessel was suture ligated with 3-0 Vicryl. The wound was copiously irrigated. We also irrigated with plain water. I changed my gloves and the wound was closed in layers with deep tissues closed with interrupted 3-0 Vicryl. The skin was closed with running 4-0 Monocryl followed by Dermabond. All counts were correct. She tolerated the procedure without apparent complication and was taken recovery in stable condition. PATIENT DISPOSITION:  PACU - hemodynamically stable.   Delay start of Pharmacological VTE agent (>24hrs) due to surgical blood loss or risk of  bleeding:  no  Georganna Skeans, MD, MPH, FACS Pager: [phone]  1/24/20181:21 PM

## 2016-02-13 NOTE — Anesthesia Procedure Notes (Signed)
Procedure Name: LMA Insertion Date/Time: 02/13/2016 12:40 PM Performed by: Baxter Flattery Pre-anesthesia Checklist: Patient identified, Emergency Drugs available, Suction available and Patient being monitored Patient Re-evaluated:Patient Re-evaluated prior to inductionOxygen Delivery Method: Circle system utilized Preoxygenation: Pre-oxygenation with 100% oxygen Intubation Type: IV induction Ventilation: Mask ventilation without difficulty LMA: LMA inserted LMA Size: 3.0 Number of attempts: 1 Airway Equipment and Method: Bite block Placement Confirmation: positive ETCO2 and breath sounds checked- equal and bilateral Tube secured with: Tape Dental Injury: Teeth and Oropharynx as per pre-operative assessment

## 2016-02-13 NOTE — H&P (Signed)
Michaela Jefferson is an 76 y.o. female.   Chief Complaint: R inguinal mass HPI: HX of melanoma R foot. Now with R inguinal lymphadenopathy.  Past Medical History:  Diagnosis Date  . DDD (degenerative disc disease), lumbar   . DJD (degenerative joint disease)   . Gastric ulcer   . GERD (gastroesophageal reflux disease)   . Hyperlipidemia   . Melanoma ([place]) 2016   right heel  . Pneumonia 2013    Past Surgical History:  Procedure Laterality Date  . ABDOMINAL HYSTERECTOMY    . APPLICATION OF A-CELL OF EXTREMITY Right 07/26/2014   Procedure: WITH PLACEMENT OF A CELL TO RIGHT HEEL;  Surgeon: Theodoro Kos, DO;  Location: [facility];  Service: Plastics;  Laterality: Right;  . APPLICATION OF A-CELL OF EXTREMITY Right 12/28/2014   Procedure: PLACEMENT OF A-CELL;  Surgeon: Wallace Going, DO;  Location: [place];  Service: Plastics;  Laterality: Right;  . CHOLECYSTECTOMY    . I&D EXTREMITY Right 12/28/2014   Procedure: DEBRIDEMENT OF RIGHT FOOT AND HEEL ;  Surgeon: Wallace Going, DO;  Location: [facility];  Service: Plastics;  Laterality: Right;  . INGUINAL LYMPH NODE BIOPSY Right 09/20/2014   Procedure: RIGHT INGUINAL SENTINEL  LYMPH NODE BIOPSY;  Surgeon: Georganna Skeans, MD;  Location: [place];  Service: General;  Laterality: Right;  . MASS EXCISION Right 07/26/2014   Procedure: EXCISION OF RIGHT HEEL MELANOMA ;  Surgeon: Theodoro Kos, DO;  Location: [facility];  Service: Plastics;  Laterality: Right;  . MASS EXCISION Right 09/20/2014   Procedure: RE-EXCISION OF RIGHT HEEL MELANOMA FOR WIDER MARGINS WITH APPLICATION OF A CELL;  Surgeon: Theodoro Kos, DO;  Location: [place] [facility];  Service: Plastics;  Laterality: Right;  . TONSILLECTOMY      History reviewed. No pertinent family history. Social History:  reports that she quit smoking about 20 years ago. She has never used smokeless tobacco. She reports that she drinks alcohol. She  reports that she does not use drugs.  Allergies:  Allergies  Allergen Reactions  . Sulfa Antibiotics Anaphylaxis    Has not taken since childhood  . Sulfonamide Derivatives Anaphylaxis    Has not taken since childhood    Medications Prior to Admission  Medication Sig Dispense Refill  . HYDROcodone-acetaminophen (NORCO) 5-325 MG tablet Take 1 tablet by mouth every 6 (six) hours as needed for moderate pain. 30 tablet 0  . pantoprazole (PROTONIX) 40 MG tablet Take 40 mg by mouth daily.      No results found for this or any previous visit (from the past 48 hour(s)). No results found.  ROS  Pulse 67, temperature 98.4 F (36.9 C), temperature source Oral, resp. rate 16, height 5\' 3"  (1.6 m), weight 59.4 kg (131 lb), SpO2 100 %. Physical Exam  Constitutional: She is oriented to person, place, and time. She appears well-developed and well-nourished.  HENT:  Head: Normocephalic.  Eyes: EOM are normal. Pupils are equal, round, and reactive to light.  Neck: Neck supple.  Cardiovascular: Normal rate and normal heart sounds.   Respiratory: Effort normal and breath sounds normal.  GI: Soft. She exhibits no distension. There is no tenderness.  Musculoskeletal:  Incision upper back from derm procedure  R inguinal mass 2.5cm  Neurological: She is alert and oriented to person, place, and time.  Skin: Skin is warm.  Psychiatric: She has a normal mood and affect.     Assessment/Plan R inguinal lymphadenopathy - for  R inguinal LN biopsy. Procedure, risks, benefits discussed. She agrees.  Zenovia Jarred, MD 02/13/2016, 12:25 PM

## 2016-02-13 NOTE — Interval H&P Note (Signed)
History and Physical Interval Note:  02/13/2016 12:28 PM  Michaela Jefferson  has presented today for surgery, with the diagnosis of right inguinal lymphadenopathy  The various methods of treatment have been discussed with the patient and family. After consideration of risks, benefits and other options for treatment, the patient has consented to  Procedure(s): RIGHT INGUINAL LYMPH NODE BIOPSY (Right) as a surgical intervention .  The patient's history has been reviewed, patient examined, no change in status, stable for surgery.  I have reviewed the patient's chart and labs.  Questions were answered to the patient's satisfaction.     Kalley Nicholl E

## 2016-02-13 NOTE — Discharge Instructions (Signed)

## 2016-02-13 NOTE — Anesthesia Preprocedure Evaluation (Signed)
Anesthesia Evaluation  Patient identified by MRN, date of birth, ID band Patient awake    Reviewed: Allergy & Precautions, NPO status , Patient's Chart, lab work & pertinent test results  Airway Mallampati: II  TM Distance: >3 FB Neck ROM: Full    Dental no notable dental hx. (+) Caps   Pulmonary neg pulmonary ROS, former smoker,    Pulmonary exam normal breath sounds clear to auscultation       Cardiovascular negative cardio ROS Normal cardiovascular exam Rhythm:Regular Rate:Normal     Neuro/Psych negative neurological ROS  negative psych ROS   GI/Hepatic Neg liver ROS, PUD, GERD  Controlled,  Endo/Other  negative endocrine ROS  Renal/GU negative Renal ROS  negative genitourinary   Musculoskeletal negative musculoskeletal ROS (+)   Abdominal   Peds negative pediatric ROS (+)  Hematology negative hematology ROS (+)   Anesthesia Other Findings   Reproductive/Obstetrics negative OB ROS                             Anesthesia Physical Anesthesia Plan  ASA: II  Anesthesia Plan: General   Post-op Pain Management:    Induction: Intravenous  Airway Management Planned: LMA  Additional Equipment:   Intra-op Plan:   Post-operative Plan: Extubation in OR  Informed Consent: I have reviewed the patients History and Physical, chart, labs and discussed the procedure including the risks, benefits and alternatives for the proposed anesthesia with the patient or authorized representative who has indicated his/her understanding and acceptance.   Dental advisory given  Plan Discussed with: CRNA  Anesthesia Plan Comments:         Anesthesia Quick Evaluation

## 2016-02-13 NOTE — Transfer of Care (Signed)
Immediate Anesthesia Transfer of Care Note  Patient: Michaela Jefferson  Procedure(s) Performed: Procedure(s): RIGHT INGUINAL LYMPH NODE BIOPSY (Right)  Patient Location: PACU  Anesthesia Type:General  Level of Consciousness: awake, alert , oriented and patient cooperative  Airway & Oxygen Therapy: Patient Spontanous Breathing and Patient connected to face mask oxygen  Post-op Assessment: Report given to RN, Post -op Vital signs reviewed and stable and Patient moving all extremities  Post vital signs: Reviewed and stable  Last Vitals:  Vitals:   02/13/16 1138  Pulse: 67  Resp: 16  Temp: 36.9 C    Last Pain:  Vitals:   02/13/16 1138  TempSrc: Oral  PainSc: 7          Complications: No apparent anesthesia complications

## 2016-02-13 NOTE — Anesthesia Postprocedure Evaluation (Signed)
Anesthesia Post Note  Patient: Michaela Jefferson  Procedure(s) Performed: Procedure(s) (LRB): RIGHT INGUINAL LYMPH NODE BIOPSY (Right)  Patient location during evaluation: PACU Anesthesia Type: General Level of consciousness: awake and alert Pain management: pain level controlled Vital Signs Assessment: post-procedure vital signs reviewed and stable Respiratory status: spontaneous breathing, nonlabored ventilation, respiratory function stable and patient connected to nasal cannula oxygen Cardiovascular status: blood pressure returned to baseline and stable Postop Assessment: no signs of nausea or vomiting Anesthetic complications: no       Last Vitals:  Vitals:   02/13/16 1427 02/13/16 1445  BP: (!) 182/97 (!) 186/93  Pulse: 73 77  Resp: 15 18  Temp:  36.9 C    Last Pain:  Vitals:   02/13/16 1445  TempSrc: Oral  PainSc: 4                  Montez Hageman

## 2016-02-14 ENCOUNTER — Encounter (HOSPITAL_BASED_OUTPATIENT_CLINIC_OR_DEPARTMENT_OTHER): Payer: Self-pay | Admitting: General Surgery

## 2016-02-29 ENCOUNTER — Telehealth: Payer: Self-pay | Admitting: Oncology

## 2016-02-29 ENCOUNTER — Encounter: Payer: Self-pay | Admitting: Oncology

## 2016-02-29 NOTE — Telephone Encounter (Signed)
Appt has been scheduled for the pt to see Dr. Alen Blew on 2/14 at [facility] to arrive 30 minutes early. Letter aware to arrive 30 minutes.

## 2016-03-05 ENCOUNTER — Ambulatory Visit (HOSPITAL_BASED_OUTPATIENT_CLINIC_OR_DEPARTMENT_OTHER): Payer: Managed Care, Other (non HMO) | Admitting: Oncology

## 2016-03-05 ENCOUNTER — Telehealth: Payer: Self-pay | Admitting: Oncology

## 2016-03-05 VITALS — BP 159/83 | HR 60 | Temp 98.3°F | Resp 18 | Ht 63.0 in | Wt 135.3 lb

## 2016-03-05 DIAGNOSIS — C4371 Malignant melanoma of right lower limb, including hip: Secondary | ICD-10-CM

## 2016-03-05 DIAGNOSIS — Z87891 Personal history of nicotine dependence: Secondary | ICD-10-CM

## 2016-03-05 DIAGNOSIS — M25551 Pain in right hip: Secondary | ICD-10-CM | POA: Diagnosis not present

## 2016-03-05 DIAGNOSIS — C773 Secondary and unspecified malignant neoplasm of axilla and upper limb lymph nodes: Secondary | ICD-10-CM | POA: Diagnosis not present

## 2016-03-05 DIAGNOSIS — C439 Malignant melanoma of skin, unspecified: Secondary | ICD-10-CM

## 2016-03-05 NOTE — Progress Notes (Signed)
Reason for Referral: Melanoma.   HPI: 76 year old woman currently of [place] where she lived the majority of her life. She is a pleasant woman in reasonably good health except for history of degenerative disc disease. She was in her usual state of health when she was noted to have a lesion on her right heel while she was getting her toenail done. She had a biopsy done by her podiatrist which suggested the presence of malignant melanoma. Patient was evaluated initially by Dr. Marin Olp and was referred to Dr. Grandville Silos and Dr. Marla Roe. On July 2016 she underwent excision of a right heel melanoma done by Dr. Marla Roe and the pathology showed malignant melanoma superficial subtype associated with melanoma in situ. At that time the measurement was 1.7 mm pathological staging was T2a.  On 09/20/2014 she had a reexcision of the right heel melanoma for wider margin as well as a right inguinal lymph node biopsy at that time by Dr. Grandville Silos. The final pathology from this procedure showed no residual melanoma with nodal involvement of the sentinel lymph node right inguinal biopsy.  She recovered slowly over a period of time and have regained most activities of daily living although was limited by increase in right-sided hip pain likely related to degenerative arthritis. She is scheduled to have right hip replacement in March 2018.   In the last few weeks she started developing right inguinal swelling and pain and palpated a mass in that area. She underwent right inguinal lymph node biopsy by Dr. Grandville Silos on 02/13/2016. The final pathology revealed the presence of malignant melanoma.  Clinically, she has reported increased right inguinal pain and right leg edema. DVT was ruled out by ultrasound Doppler. Despite the swelling and pain, she still ambulates without any major difficulties. She does use a cane for support predominantly because of her hip. She still works part-time at [employer]. She denied any other  constitutional symptoms of appetite changes or weight loss.  She does not report any headaches, blurry vision, syncope or seizures. She does not report any fevers, chills, sweats or weight loss. She does not report any chest pain, palpitation, orthopnea or leg edema. She does not report any cough, wheezing or hemoptysis. She does not report any nausea, vomiting or abdominal pain. She does not report any constipation, diarrhea or change in her bowel habits. She does not report any frequency urgency or hesitancy. She does not report any skeletal complaints. Remaining review of systems unremarkable.      Past Medical History:  Diagnosis Date  . DDD (degenerative disc disease), lumbar   . DJD (degenerative joint disease)   . Gastric ulcer   . GERD (gastroesophageal reflux disease)   . Hyperlipidemia   . Melanoma ([place]) 2016   right heel  . Pneumonia 2013  :  Past Surgical History:  Procedure Laterality Date  . ABDOMINAL HYSTERECTOMY    . APPLICATION OF A-CELL OF EXTREMITY Right 07/26/2014   Procedure: WITH PLACEMENT OF A CELL TO RIGHT HEEL;  Surgeon: Theodoro Kos, DO;  Location: [place] [facility];  Service: Plastics;  Laterality: Right;  . APPLICATION OF A-CELL OF EXTREMITY Right 12/28/2014   Procedure: PLACEMENT OF A-CELL;  Surgeon: Wallace Going, DO;  Location: [place];  Service: Plastics;  Laterality: Right;  . CHOLECYSTECTOMY    . I&D EXTREMITY Right 12/28/2014   Procedure: DEBRIDEMENT OF RIGHT FOOT AND HEEL ;  Surgeon: Wallace Going, DO;  Location: [place];  Service: Plastics;  Laterality:  Right;  Marland Kitchen INGUINAL LYMPH NODE BIOPSY Right 09/20/2014   Procedure: RIGHT INGUINAL SENTINEL  LYMPH NODE BIOPSY;  Surgeon: Georganna Skeans, MD;  Location: [place];  Service: General;  Laterality: Right;  . INGUINAL LYMPH NODE BIOPSY Right 02/13/2016   Procedure: RIGHT INGUINAL LYMPH NODE BIOPSY;  Surgeon: Georganna Skeans, MD;  Location: [place];  Service: General;  Laterality: Right;  . MASS EXCISION Right 07/26/2014   Procedure: EXCISION OF RIGHT HEEL MELANOMA ;  Surgeon: Theodoro Kos, DO;  Location: [place];  Service: Plastics;  Laterality: Right;  . MASS EXCISION Right 09/20/2014   Procedure: RE-EXCISION OF RIGHT HEEL MELANOMA FOR WIDER MARGINS WITH APPLICATION OF A CELL;  Surgeon: Theodoro Kos, DO;  Location: [place];  Service: Plastics;  Laterality: Right;  . TONSILLECTOMY    :   Current Outpatient Prescriptions:  .  HYDROcodone-acetaminophen (NORCO) 5-325 MG tablet, Take 1-2 tablets by mouth every 4 (four) hours as needed for moderate pain or severe pain., Disp: 20 tablet, Rfl: 0 .  pantoprazole (PROTONIX) 40 MG tablet, Take 40 mg by mouth daily., Disp: , Rfl: :  Allergies  Allergen Reactions  . Sulfa Antibiotics Other (See Comments)    Patient stated,"I haven't had since childhood and I don't know what kind of reaction I have."  . Sulfonamide Derivatives Other (See Comments)    Patient stated,"I haven't had since childhood and I don't know what kind of reaction I have."  :  No family history on file.:  Social History   Social History  . Marital status: Divorced    Spouse name: N/A  . Number of children: N/A  . Years of education: N/A   Occupational History  . Not on file.   Social History Main Topics  . Smoking status: Former Smoker    Quit date: 01/21/1996  . Smokeless tobacco: Never Used  . Alcohol use Yes     Comment: occasional  . Drug use: No  . Sexual activity: Not on file   Other Topics Concern  . Not on file   Social History Narrative  . No narrative on file  :  Pertinent items are noted in HPI.  Exam: Blood pressure (!) 159/83, pulse 60, temperature 98.3 F (36.8 C), temperature source Oral, resp. rate 18, height _0  (1.6 m), weight 135 lb 4.8 oz (61.4 kg), SpO2 99 %.  ECOG 1  General appearance: alert and cooperative appeared without distress. Head:  Normocephalic, without obvious abnormality Throat: lips, mucosa, and tongue normal; teeth and gums normal no oral thrush. Neck: no adenopathy Back: negative Resp: clear to auscultation bilaterally Cardio: regular rate and rhythm, S1, S2 normal, no murmur, click, rub or gallop GI: soft, non-tender; bowel sounds normal; no masses,  no organomegaly Extremities: Right leg edema noted with enlarged right inguinal area. Pulses: 2+ and symmetric  CBC    Component Value Date/Time   WBC 6.1 09/11/2014 1330   RBC 4.34 09/11/2014 1330   HGB 13.3 09/11/2014 1330   HGB 13.7 06/20/2014 1050   HCT 40.2 09/11/2014 1330   HCT 41.1 06/20/2014 1050   PLT 252 09/11/2014 1330   PLT 249 06/20/2014 1050   MCV 92.6 09/11/2014 1330   MCV 97 06/20/2014 1050   MCH 30.6 09/11/2014 1330   MCHC 33.1 09/11/2014 1330   RDW 13.4 09/11/2014 1330   RDW 13.9 06/20/2014 1050   LYMPHSABS 1.4 06/20/2014 1050   MONOABS 0.4 11/01/2009 0849   EOSABS 0.1 06/20/2014  1050   BASOSABS 0.0 06/20/2014 1050     Assessment and Plan:   76 year old woman with the following issues:  1. Superficial spreading melanoma diagnosed in 2016. She underwent a wide excision of the right heel which showed a 1.7 mm superficial spreading melanoma with clinical stage T2aN0. She underwent repeat wide excision and sentinel lymph node biopsy in August 2016 which showed no residual melanoma at that time.  She developed recurrent disease with a biopsy-proven right inguinal adenopathy on 02/13/2016.  The natural course of this disease was discussed today with the patient. She will need to have staging studies done at this time including whole body PET CT scan as well as brain MRI. We will also test her tumor for BRAF and her case will be discussed in the melanoma multidisciplinary tumor Board.  Her options of therapy would include complete lymph node dissection and adjuvant radiation therapy versus systemic therapy. This will be determined by  the extent of her disease and her BRAF mutational status. She is already developing some mild lymphedema which could prohibit further lymph node dissection in that area.  She will follow-up to discuss the final recommendation after completing her staging workup and further testing.  She understands that she has a rather aggressive tumor that is at risk of spreading systemically to multiple organs including lung, bone, brain, and others. However, treatment options to exist and she will be an excellent candidate for any aggressive therapy given her reasonable performance status.  2. Right hip pain: Appears to be degenerative arthritis in nature. I'm in favor of having her hip operation not to be delayed because of quality of life issues. She believes that she can no longer have a reasonable quality of life because of it and prefers to have that done.  I have no objections to having that done and we will plan melanoma treatment around this issue.  3. Follow-up: With the next few weeks to discuss the results of her workup.

## 2016-03-05 NOTE — Telephone Encounter (Signed)
Appointments scheduled per 2/14 LOS. Patient given AVS Report and calendars with future scheduled appointments.

## 2016-03-06 ENCOUNTER — Other Ambulatory Visit (HOSPITAL_COMMUNITY)
Admission: RE | Admit: 2016-03-06 | Discharge: 2016-03-06 | Disposition: A | Discharge status: Home / Self Care | Payer: Managed Care, Other (non HMO) | Source: Ambulatory Visit | Attending: Oncology | Admitting: Oncology

## 2016-03-06 DIAGNOSIS — C774 Secondary and unspecified malignant neoplasm of inguinal and lower limb lymph nodes: Secondary | ICD-10-CM | POA: Diagnosis present

## 2016-03-06 DIAGNOSIS — Z85820 Personal history of malignant melanoma of skin: Secondary | ICD-10-CM | POA: Diagnosis not present

## 2016-03-07 ENCOUNTER — Encounter: Payer: Self-pay | Admitting: *Deleted

## 2016-03-07 NOTE — Progress Notes (Signed)
Spoke with patient, let her know that nicole was working on getting her scans approved

## 2016-03-17 ENCOUNTER — Telehealth: Payer: Self-pay | Admitting: *Deleted

## 2016-03-17 ENCOUNTER — Encounter (HOSPITAL_COMMUNITY): Payer: Self-pay

## 2016-03-17 NOTE — Telephone Encounter (Signed)
Left message for patient to keep her appointment with Dr. Alen Blew tomorrow, 03/18/16. Instructed patient to call back if she had any questions or concerns.

## 2016-03-18 ENCOUNTER — Telehealth: Payer: Self-pay | Admitting: Oncology

## 2016-03-18 ENCOUNTER — Encounter (HOSPITAL_COMMUNITY)
Admission: RE | Admit: 2016-03-18 | Discharge: 2016-03-18 | Disposition: A | Discharge status: Home / Self Care | Payer: Managed Care, Other (non HMO) | Source: Ambulatory Visit | Attending: Oncology | Admitting: Oncology

## 2016-03-18 ENCOUNTER — Ambulatory Visit (HOSPITAL_COMMUNITY)
Admission: RE | Admit: 2016-03-18 | Discharge: 2016-03-18 | Disposition: A | Discharge status: Home / Self Care | Payer: Managed Care, Other (non HMO) | Source: Ambulatory Visit | Attending: Oncology | Admitting: Oncology

## 2016-03-18 ENCOUNTER — Ambulatory Visit (HOSPITAL_BASED_OUTPATIENT_CLINIC_OR_DEPARTMENT_OTHER): Payer: Managed Care, Other (non HMO) | Admitting: Oncology

## 2016-03-18 ENCOUNTER — Other Ambulatory Visit (HOSPITAL_BASED_OUTPATIENT_CLINIC_OR_DEPARTMENT_OTHER): Payer: Managed Care, Other (non HMO)

## 2016-03-18 VITALS — BP 155/77 | HR 73 | Temp 98.5°F | Resp 17 | Ht 63.0 in | Wt 133.2 lb

## 2016-03-18 DIAGNOSIS — M25551 Pain in right hip: Secondary | ICD-10-CM

## 2016-03-18 DIAGNOSIS — I999 Unspecified disorder of circulatory system: Secondary | ICD-10-CM | POA: Diagnosis not present

## 2016-03-18 DIAGNOSIS — R2241 Localized swelling, mass and lump, right lower limb: Secondary | ICD-10-CM | POA: Diagnosis not present

## 2016-03-18 DIAGNOSIS — C439 Malignant melanoma of skin, unspecified: Secondary | ICD-10-CM | POA: Insufficient documentation

## 2016-03-18 DIAGNOSIS — L989 Disorder of the skin and subcutaneous tissue, unspecified: Secondary | ICD-10-CM | POA: Diagnosis not present

## 2016-03-18 DIAGNOSIS — C773 Secondary and unspecified malignant neoplasm of axilla and upper limb lymph nodes: Secondary | ICD-10-CM

## 2016-03-18 DIAGNOSIS — C4371 Malignant melanoma of right lower limb, including hip: Secondary | ICD-10-CM

## 2016-03-18 DIAGNOSIS — G319 Degenerative disease of nervous system, unspecified: Secondary | ICD-10-CM | POA: Insufficient documentation

## 2016-03-18 LAB — CBC WITH DIFFERENTIAL/PLATELET
BASO%: 0.1 % (ref 0.0–2.0)
BASOS ABS: 0 10*3/uL (ref 0.0–0.1)
EOS ABS: 0.1 10*3/uL (ref 0.0–0.5)
EOS%: 1.5 % (ref 0.0–7.0)
HEMATOCRIT: 38.7 % (ref 34.8–46.6)
HEMOGLOBIN: 12.6 g/dL (ref 11.6–15.9)
LYMPH%: 19.1 % (ref 14.0–49.7)
MCH: 30.1 pg (ref 25.1–34.0)
MCHC: 32.6 g/dL (ref 31.5–36.0)
MCV: 92.6 fL (ref 79.5–101.0)
MONO#: 0.6 10*3/uL (ref 0.1–0.9)
MONO%: 6.5 % (ref 0.0–14.0)
NEUT#: 6.3 10*3/uL (ref 1.5–6.5)
NEUT%: 72.8 % (ref 38.4–76.8)
Platelets: 257 10*3/uL (ref 145–400)
RBC: 4.18 10*6/uL (ref 3.70–5.45)
RDW: 16.8 % — AB (ref 11.2–14.5)
WBC: 8.6 10*3/uL (ref 3.9–10.3)
lymph#: 1.7 10*3/uL (ref 0.9–3.3)

## 2016-03-18 LAB — COMPREHENSIVE METABOLIC PANEL
ALBUMIN: 4.2 g/dL (ref 3.5–5.0)
ALK PHOS: 91 U/L (ref 40–150)
ALT: 9 U/L (ref 0–55)
AST: 14 U/L (ref 5–34)
Anion Gap: 7 mEq/L (ref 3–11)
BUN: 11.7 mg/dL (ref 7.0–26.0)
CALCIUM: 10.2 mg/dL (ref 8.4–10.4)
CHLORIDE: 109 meq/L (ref 98–109)
CO2: 30 mEq/L — ABNORMAL HIGH (ref 22–29)
Creatinine: 0.8 mg/dL (ref 0.6–1.1)
EGFR: 85 mL/min/{1.73_m2} — AB (ref >90)
Glucose: 96 mg/dl (ref 70–140)
POTASSIUM: 4.6 meq/L (ref 3.5–5.1)
SODIUM: 145 meq/L (ref 136–145)
Total Bilirubin: 0.33 mg/dL (ref 0.20–1.20)
Total Protein: 6.8 g/dL (ref 6.4–8.3)

## 2016-03-18 LAB — GLUCOSE, CAPILLARY: GLUCOSE-CAPILLARY: 95 mg/dL (ref 65–99)

## 2016-03-18 LAB — POCT I-STAT CREATININE: Creatinine, Ser: 0.7 mg/dL (ref 0.44–1.00)

## 2016-03-18 MED ORDER — GADOBENATE DIMEGLUMINE 529 MG/ML IV SOLN
15.0000 mL | Freq: Once | INTRAVENOUS | Status: AC | PRN
  Administered 2016-03-18: 12 mL via INTRAVENOUS

## 2016-03-18 MED ORDER — FLUDEOXYGLUCOSE F - 18 (FDG) INJECTION: 6.5000 | Freq: Once | INTRAVENOUS | Status: DC | PRN

## 2016-03-18 NOTE — Telephone Encounter (Signed)
Appointments scheduled per 03/18/16 los. Patient was given a copy of the AVS report and appointment schedule per 03/18/16 los. °

## 2016-03-18 NOTE — Progress Notes (Signed)
Hematology and Oncology Follow Up Visit  Michaela Jefferson [id number] 04/06/1940 76 y.o. 03/18/2016 3:29 PM Michaela Jefferson, MDSmith, Michaela Plater, MD   Principle Diagnosis: 76 year old woman with superficial spreading melanoma diagnosed in 2016. She presented with a 1.7 mm lesion on her right heel. She subsequently developed right inguinal lymph node metastasis biopsy-proven in January 2018. BRAF status is pending.   Prior Therapy: On July 2016 she underwent excision of a right heel melanoma done by Dr. Marla Roe and the pathology showed malignant melanoma superficial subtype associated with melanoma in situ. At that time the measurement was 1.7 mm pathological staging was T2a. Sentinel lymph node biopsy was negative for advanced.  She developed a right inguinal mass and underwent a biopsy on 02/13/2016 confirmed the presence of metastatic melanoma.  Current therapy: Under evaluation for systemic therapy.  Interim History: Michaela Jefferson presents today for a follow-up visit. Since the last visit, she underwent staging workup including a PET scan and MRI and her case was discussed and the melanoma multidisciplinary conference. Clinically, she reports no major changes in her health. She continues to have swelling in her right groin but no increased pain or discomfort. She still ambulating with the help of a cane but debilitated by her hip discomfort. She is planning to have surgery done in the end of March.  She does not report any headaches, blurry vision, syncope or seizures. She does not report any fevers, chills, sweats or weight loss. She does not report any chest pain, palpitation, orthopnea or leg edema. She does not report any cough, wheezing or hemoptysis. She does not report any nausea, vomiting or abdominal pain. She does not report any constipation, diarrhea or change in her bowel habits. She does not report any frequency urgency or hesitancy. She does not report any skeletal complaints.  Remaining review of systems unremarkable.   Medications: I have reviewed the patient's current medications.  Current Outpatient Prescriptions  Medication Sig Dispense Refill  . HYDROcodone-acetaminophen (NORCO) 5-325 MG tablet Take 1-2 tablets by mouth every 4 (four) hours as needed for moderate pain or severe pain. 20 tablet 0  . pantoprazole (PROTONIX) 40 MG tablet Take 40 mg by mouth daily.     No current facility-administered medications for this visit.    Facility-Administered Medications Ordered in Other Visits  Medication Dose Route Frequency Provider Last Rate Last Dose  . fludeoxyglucose F - 18 (FDG) injection 6.5 millicurie  6.5 millicurie Intravenous Once PRN Genia Del, MD         Allergies:  Allergies  Allergen Reactions  . Sulfa Antibiotics Other (See Comments)    Patient stated,"I haven't had since childhood and I don't know what kind of reaction I have."  . Sulfonamide Derivatives Other (See Comments)    Patient stated,"I haven't had since childhood and I don't know what kind of reaction I have."    Past Medical History, Surgical history, Social history, and Family History were reviewed and updated.   Physical Exam: Blood pressure (!) 155/77, pulse 73, temperature 98.5 F (36.9 C), temperature source Oral, resp. rate 17, height 5' 3"  (1.6 m), weight 133 lb 3.2 oz (60.4 kg), SpO2 99 %. ECOG: 1 General appearance: alert and cooperative Head: Normocephalic, without obvious abnormality Neck: no adenopathy. Left sided protrusion that is ulcerated on the posterior aspect of her neck noted. Lymph nodes: Cervical, supraclavicular, and axillary nodes normal. Heart:regular rate and rhythm, S1, S2 normal, no murmur, click, rub or gallop Lung:chest clear, no wheezing, rales, normal  symmetric air entry.  Abdomin: soft, non-tender, without masses or organomegaly EXT:no erythema, induration, or nodules. Right leg edema noted unchanged.   Lab Results: Lab Results   Component Value Date   WBC 8.6 03/18/2016   HGB 12.6 03/18/2016   HCT 38.7 03/18/2016   MCV 92.6 03/18/2016   PLT 257 03/18/2016     Chemistry      Component Value Date/Time   NA 142 06/20/2014 1051   K 3.9 06/20/2014 1051   CL 104 06/20/2014 1051   CO2 29 06/20/2014 1051   BUN 14 06/20/2014 1051   CREATININE 0.70 03/18/2016 1035      Component Value Date/Time   CALCIUM 9.3 06/20/2014 1051   ALKPHOS 78 06/20/2014 1051   AST 13 06/20/2014 1051   ALT 14 06/20/2014 1051   BILITOT 0.4 06/20/2014 1051       Radiological Studies: Mr Jeri Cos GX Contrast  Result Date: 03/18/2016 CLINICAL DATA:  Melanoma.  Staging. EXAM: MRI HEAD WITHOUT AND WITH CONTRAST TECHNIQUE: Multiplanar, multiecho pulse sequences of the brain and surrounding structures were obtained without and with intravenous contrast. CONTRAST:  25m MULTIHANCE GADOBENATE DIMEGLUMINE 529 MG/ML IV SOLN COMPARISON:  None. FINDINGS: Brain: No evidence for acute infarction, hemorrhage, mass lesion, hydrocephalus, or extra-axial fluid. Mild atrophy. Mild subcortical and periventricular T2 and FLAIR hyperintensities, likely chronic microvascular ischemic change. Post infusion, no abnormal enhancement of the brain or meninges. Vascular: Flow voids are maintained throughout the carotid, basilar, and vertebral arteries. There are no areas of chronic hemorrhage. No areas of susceptibility on gradient sequence are seen to suggest melanotic disease to the brain. Skull and upper cervical spine: Normal marrow signal. Sinuses/Orbits: Negative. Other: None. IMPRESSION: Mild atrophy and small vessel disease. No acute intracranial findings. No evidence for metastatic melanoma to the brain or surrounding structures. Electronically Signed   By: JStaci RighterM.D.   On: 03/18/2016 11:44   Nm Pet Image Initial (pi) Whole Body  Result Date: 03/18/2016 CLINICAL DATA:  Initial treatment strategy for melanoma. Initial melanoma involving the right heel  and subsequent right inguinal metastatic adenopathy. EXAM: NUCLEAR MEDICINE PET WHOLE BODY TECHNIQUE: 6.5 mCi F-18 FDG was injected intravenously. Full-ring PET imaging was performed from the vertex to the feet after the radiotracer. CT data was obtained and used for attenuation correction and anatomic localization. FASTING BLOOD GLUCOSE:  Value: 95 mg/dl COMPARISON:  None. FINDINGS: HEAD/NECK Focal area of hypermetabolism involving the subcutaneous tissues of the left lower neck posteriorly on the left side. SUV max is 8.7. On the CT scan this is a vague area of density measuring 12 mm. Recommend clinical correlation. Certainly could not exclude melanoma. No cervical or supraclavicular lymphadenopathy. CHEST No hypermetabolic mediastinal or hilar nodes. No suspicious pulmonary nodules on the CT scan. Moderate hypermetabolism noted around the right shoulder likely from an inflammatory process. The infraspinatus and supraspinous tendons appear torn and there is retraction and fatty atrophy of the muscles. Additional findings include aortic and coronary artery calcifications. A small hiatal hernia is noted. ABDOMEN/PELVIS No abnormal hypermetabolic activity within the liver, pancreas, adrenal glands, or spleen. No hypermetabolic lymph nodes in the abdomen or pelvis. Additional findings include advanced atherosclerotic calcifications and tortuosity of the abdominal aorta and iliac arteries. Small to moderate free pelvic fluid and small left ovarian cyst. There is a large hematoma noted in the right groin from previous lymph node dissection. This measures approximately 5 cm and various hypermetabolism around the hematoma. No enlarged or hypermetabolic inguinal lymph  nodes. SKELETON No focal hypermetabolic activity to suggest skeletal metastasis. EXTREMITIES No abnormal hypermetabolic activity in the lower extremities. Diffuse subcutaneous soft tissue swelling/edema/fluid involving the right lower extremity which is also  enlarged. Some of this could be due to venous compression from the hematoma in the groin line. Cellulitis is also possible. Deep venous thrombosis would be another consideration. IMPRESSION: 1. 12 mm subcutaneous lesion at the base of the neck on the left side posteriorly is hypermetabolic and worrisome for melanoma. Recommend clinical correlation. 2. 5 cm right groin hematoma. 3. Right lower extremity subcutaneous soft tissue swelling/edema/fluid as discussed above. 4. No findings for metastatic disease involving the chest, abdomen, pelvis or lower extremities. 5. Hypermetabolism around the right shoulder likely due to degenerative arthropathy and full-thickness rotator cuff tears. Electronically Signed   By: Marijo Sanes M.D.   On: 03/18/2016 11:08     Impression and Plan:   76 year old woman with the following issues:  1.Superficial spreading melanoma diagnosed in 2016. She underwent a wide excision of the right heel which showed a 1.7 mm superficial spreading melanoma with clinical stage T2aN0. She underwent repeat wide excision and sentinel lymph node biopsy in August 2016 which showed no residual melanoma at that time.  She developed recurrent disease with a biopsy-proven right inguinal adenopathy on 02/13/2016.  Her case was discussed in the melanoma multidisciplinary tumor board. Her PET scan and MRI were personally reviewed today and do not suggest any evidence of residual disease. The abnormal lesion on her left neck have been biopsied in the past and was not melanoma.  Ideal treatment in this particular setting would be complete lymph node dissection and her right inguinal area followed by adjuvant systemic therapy. Given the debilitation she is already experiencing in the right leg and potential hip surgery that is critical for her quality of life, this option might be difficult and not feasible. Alternatively, systemic therapy might be a reasonable option in any case given her high risk  disease in this setting.  The options of systemic therapy were reviewed today and will be dictated by her BRAF status. That will be in the form of oral targeted therapy if she has a mutation detected, or immunotherapy if she has wild-type status.  I have recommended she follow-up after her surgery is completed and we'll discuss those options at that time.  2. Right-sided hip pain: Likely arthritic in nature with a PET scan did not show any evidence of metastasis. Micrometastatic disease could be a possibility and intraoperative biopsy would be helpful as well.  3. Follow-up: Will be after her hip replacement surgery to discuss systemic therapy at that time.    Zola Button, MD 2/27/20183:29 PM

## 2016-04-20 ENCOUNTER — Encounter (HOSPITAL_BASED_OUTPATIENT_CLINIC_OR_DEPARTMENT_OTHER): Payer: Self-pay | Admitting: Emergency Medicine

## 2016-04-20 ENCOUNTER — Emergency Department (HOSPITAL_BASED_OUTPATIENT_CLINIC_OR_DEPARTMENT_OTHER)
Admission: EM | Admit: 2016-04-20 | Discharge: 2016-04-21 | Disposition: A | Discharge status: Home / Self Care | Payer: Managed Care, Other (non HMO) | Attending: Emergency Medicine | Admitting: Emergency Medicine

## 2016-04-20 DIAGNOSIS — Z87891 Personal history of nicotine dependence: Secondary | ICD-10-CM | POA: Diagnosis not present

## 2016-04-20 DIAGNOSIS — T783XXA Angioneurotic edema, initial encounter: Secondary | ICD-10-CM | POA: Diagnosis not present

## 2016-04-20 DIAGNOSIS — T7840XA Allergy, unspecified, initial encounter: Secondary | ICD-10-CM | POA: Diagnosis present

## 2016-04-20 NOTE — ED Provider Notes (Signed)
[facility] DEPT [facility] Provider Note: Georgena Spurling, MD, FACEP  CSN: [id number] MRN: [id number] ARRIVAL: 04/20/16 at 2343 ROOM: [place]  By signing my name below, I, Soijett Blue, attest that this documentation has been prepared under the direction and in the presence of Shanon Rosser, MD. Electronically Signed: Soijett Blue, ED Scribe. 04/20/16. 12:08 AM.  CHIEF COMPLAINT  Allergic Reaction   HISTORY OF PRESENT ILLNESS  Michaela Jefferson is a 76 y.o. female who presents to the Emergency Department complaining of moderate swelling of the upper lip beginning 11 AM this morning. The onset was gradual and is now involving the right cheek to a lesser degree. She is not aware of anything that has triggered this. She has not tried any medications for the relief of her symptoms. She notes that she had a right hip replacement 1 week ago and has been taking Percocet, ASA, and Protonix daily since the surgery. She has never had this happen in the past and she has not on an ACE inhibitor or an angiotensin receptor blocker. She denies tongue swelling, throat closing sensation or SOB.   Past Medical History:  Diagnosis Date  . DDD (degenerative disc disease), lumbar   . DJD (degenerative joint disease)   . Gastric ulcer   . GERD (gastroesophageal reflux disease)   . Hyperlipidemia   . Melanoma ([place]) 2016   right heel  . Pneumonia 2013    Past Surgical History:  Procedure Laterality Date  . ABDOMINAL HYSTERECTOMY    . APPLICATION OF A-CELL OF EXTREMITY Right 07/26/2014   Procedure: WITH PLACEMENT OF A CELL TO RIGHT HEEL;  Surgeon: Theodoro Kos, DO;  Location: [facility];  Service: Plastics;  Laterality: Right;  . APPLICATION OF A-CELL OF EXTREMITY Right 12/28/2014   Procedure: PLACEMENT OF A-CELL;  Surgeon: Wallace Going, DO;  Location: [facility];  Service: Plastics;  Laterality: Right;  . CHOLECYSTECTOMY    . I&D EXTREMITY Right 12/28/2014   Procedure:  DEBRIDEMENT OF RIGHT FOOT AND HEEL ;  Surgeon: Wallace Going, DO;  Location: [place];  Service: Plastics;  Laterality: Right;  . INGUINAL LYMPH NODE BIOPSY Right 09/20/2014   Procedure: RIGHT INGUINAL SENTINEL  LYMPH NODE BIOPSY;  Surgeon: Georganna Skeans, MD;  Location: [facility];  Service: General;  Laterality: Right;  . INGUINAL LYMPH NODE BIOPSY Right 02/13/2016   Procedure: RIGHT INGUINAL LYMPH NODE BIOPSY;  Surgeon: Georganna Skeans, MD;  Location: [facility];  Service: General;  Laterality: Right;  . MASS EXCISION Right 07/26/2014   Procedure: EXCISION OF RIGHT HEEL MELANOMA ;  Surgeon: Theodoro Kos, DO;  Location: [facility];  Service: Plastics;  Laterality: Right;  . MASS EXCISION Right 09/20/2014   Procedure: RE-EXCISION OF RIGHT HEEL MELANOMA FOR WIDER MARGINS WITH APPLICATION OF A CELL;  Surgeon: Theodoro Kos, DO;  Location: [facility];  Service: Plastics;  Laterality: Right;  . TONSILLECTOMY      No family history on file.  Social History  Substance Use Topics  . Smoking status: Former Smoker    Quit date: 01/21/1996  . Smokeless tobacco: Never Used  . Alcohol use Yes     Comment: occasional    Prior to Admission medications   Medication Sig Start Date End Date Taking? Authorizing Provider  HYDROcodone-acetaminophen (NORCO) 5-325 MG tablet Take 1-2 tablets by mouth every 4 (four) hours as needed for moderate pain or severe pain. 02/13/16   Georganna Skeans, MD  pantoprazole (  PROTONIX) 40 MG tablet Take 40 mg by mouth daily.    Historical Provider, MD    Allergies Sulfa antibiotics and Sulfonamide derivatives   REVIEW OF SYSTEMS  Negative except as noted here or in the History of Present Illness.   PHYSICAL EXAMINATION  Initial Vital Signs Blood pressure (!) 144/89, pulse 87, temperature 97.9 F (36.6 C), temperature source Oral, resp. rate 19, SpO2 100 %.  Examination General: Well-developed, well-nourished female in no acute  distress; appearance consistent with age of record HENT: normocephalic; atraumatic; angioedema of upper lip and right cheek; no dysphonia; no stridor; no swelling of tongue or pharynx. Eyes: pupils equal, round and reactive to light; extraocular muscles intact; arcus senilis bilaterally. Neck: supple Heart: regular rate and rhythm Lungs: clear to auscultation bilaterally Abdomen: soft; nondistended; nontender; no masses or hepatosplenomegaly; bowel sounds present.  Extremities: Arthritic changes; tenderness to right hip; full range of motion except right hip; pulses normal Neurologic: Awake, alert and oriented; motor function intact in all extremities and symmetric; no facial droop Skin: Warm and dry Psychiatric: Normal mood and affect   RESULTS  Summary of this visit's results, reviewed by myself:   EKG Interpretation  Date/Time:    Ventricular Rate:    PR Interval:    QRS Duration:   QT Interval:    QTC Calculation:   R Axis:     Text Interpretation:        Laboratory Studies: No results found for this or any previous visit (from the past 24 hour(s)). Imaging Studies: No results found.  ED COURSE  Nursing notes and initial vitals signs, including pulse oximetry, reviewed.  Vitals:   04/20/16 2347 04/21/16 0125  BP: (!) 144/89 128/78  Pulse: 87 72  Resp: 19 20  Temp: 97.9 F (36.6 C) 98 F (36.7 C)  TempSrc: Oral Oral  SpO2: 100% 98%   2:13 AM Improvement in angioedema after IV medications. We will have the patient take Benadryl every 4 hours and she plans to follow-up with her primary care physician, Dr. Tamala Julian, at [facility] later today.  PROCEDURES    ED DIAGNOSES     ICD-9-CM ICD-10-CM   1. Allergic angioedema, initial encounter 995.1 T78.3XXA     I personally performed the services described in this documentation, which was scribed in my presence. The recorded information has been reviewed and is accurate.     Shanon Rosser, MD 04/21/16  [phone]

## 2016-04-20 NOTE — ED Triage Notes (Signed)
Pt presents to ED with complaints of allergic reaction . Pt had hip replacement last week and has been taking percocet and now has facial swelling. No shortness of breath

## 2016-04-21 MED ORDER — DEXAMETHASONE SODIUM PHOSPHATE 10 MG/ML IJ SOLN
10.0000 mg | Freq: Once | INTRAMUSCULAR | Status: AC
  Administered 2016-04-21: 10 mg via INTRAVENOUS
  Filled 2016-04-21: qty 1

## 2016-04-21 MED ORDER — FAMOTIDINE IN NACL 20-0.9 MG/50ML-% IV SOLN
20.0000 mg | Freq: Once | INTRAVENOUS | Status: AC
  Administered 2016-04-21: 20 mg via INTRAVENOUS
  Filled 2016-04-21 (×2): qty 50

## 2016-04-21 MED ORDER — DIPHENHYDRAMINE HCL 25 MG PO TABS: ORAL_TABLET | ORAL | Status: DC

## 2016-04-21 MED ORDER — DIPHENHYDRAMINE HCL 50 MG/ML IJ SOLN
25.0000 mg | Freq: Once | INTRAMUSCULAR | Status: AC
  Administered 2016-04-21: 25 mg via INTRAVENOUS
  Filled 2016-04-21: qty 1

## 2016-04-21 NOTE — ED Notes (Signed)
Assisted to bathroom without difficulty. Some swelling noted to lips, but swelling is overall decreased. Denies other s/s.

## 2016-04-21 NOTE — ED Notes (Signed)
Pt and family given d/c instructions as per chart. Rx x 1. Verbalizes understanding. No questions. 

## 2016-04-21 NOTE — ED Notes (Signed)
Given ice pack

## 2016-04-21 NOTE — ED Notes (Signed)
ED Provider at bedside. 

## 2016-05-01 ENCOUNTER — Encounter: Payer: Self-pay | Admitting: *Deleted

## 2016-05-15 ENCOUNTER — Ambulatory Visit (HOSPITAL_BASED_OUTPATIENT_CLINIC_OR_DEPARTMENT_OTHER): Payer: Managed Care, Other (non HMO) | Admitting: Oncology

## 2016-05-15 ENCOUNTER — Telehealth: Payer: Self-pay | Admitting: Oncology

## 2016-05-15 VITALS — BP 138/90 | HR 68 | Temp 98.3°F | Resp 18 | Ht 63.0 in | Wt 124.9 lb

## 2016-05-15 DIAGNOSIS — C4371 Malignant melanoma of right lower limb, including hip: Secondary | ICD-10-CM | POA: Diagnosis not present

## 2016-05-15 DIAGNOSIS — C773 Secondary and unspecified malignant neoplasm of axilla and upper limb lymph nodes: Secondary | ICD-10-CM | POA: Diagnosis not present

## 2016-05-15 DIAGNOSIS — M25551 Pain in right hip: Secondary | ICD-10-CM

## 2016-05-15 DIAGNOSIS — C439 Malignant melanoma of skin, unspecified: Secondary | ICD-10-CM

## 2016-05-15 NOTE — Progress Notes (Signed)
Hematology and Oncology Follow Up Visit  Michaela Jefferson [id number] 1940/02/13 76 y.o. 05/15/2016 1:30 PM Michaela Jefferson, MDSmith, Michaela Plater, MD   Principle Diagnosis: 76 year old woman with superficial spreading melanoma diagnosed in 2016. She presented with a 1.7 mm lesion on her right heel. She subsequently developed right inguinal lymph node metastasis biopsy-proven in January 2018. BRAF status is pending.   Prior Therapy: On July 2016 she underwent excision of a right heel melanoma done by Dr. Marla Roe and the pathology showed malignant melanoma superficial subtype associated with melanoma in situ. At that time the measurement was 1.7 mm pathological staging was T2a. Sentinel lymph node biopsy was negative for advanced.  She developed a right inguinal mass and underwent a biopsy on 02/13/2016 confirmed the presence of metastatic melanoma.  Current therapy: Surveillance and surveillance. She is under evaluation for systemic therapy.  Interim History: [name] presents today for a follow-up visit. Since the last visit, she underwent right hip replacement surgery and biopsy at that time. She recovered reasonably well without any delayed complications. She noticed significant improvement in her overall pain and mobility. She has regained most activities of daily living although she has not started driving or resumed to her work-related duties. Her right leg swelling has also improved and reports no groin pain.  She does not report any headaches, blurry vision, syncope or seizures. She does not report any fevers, chills, sweats or weight loss. She does not report any chest pain, palpitation, orthopnea or leg edema. She does not report any cough, wheezing or hemoptysis. She does not report any nausea, vomiting or abdominal pain. She does not report any constipation, diarrhea or change in her bowel habits. She does not report any frequency urgency or hesitancy. She does not report any  skeletal complaints. Remaining review of systems unremarkable.   Medications: I have reviewed the patient's current medications.  Current Outpatient Prescriptions  Medication Sig Dispense Refill  . diphenhydrAMINE (BENADRYL) 25 MG tablet Take one tablet every 4 hours for lip swelling.    Marland Kitchen HYDROcodone-acetaminophen (NORCO) 5-325 MG tablet Take 1-2 tablets by mouth every 4 (four) hours as needed for moderate pain or severe pain. 20 tablet 0  . pantoprazole (PROTONIX) 40 MG tablet Take 40 mg by mouth daily.     No current facility-administered medications for this visit.      Allergies:  Allergies  Allergen Reactions  . Sulfa Antibiotics Other (See Comments)    Patient stated,"I haven't had since childhood and I don't know what kind of reaction I have."  . Sulfonamide Derivatives Other (See Comments)    Patient stated,"I haven't had since childhood and I don't know what kind of reaction I have."    Past Medical History, Surgical history, Social history, and Family History were reviewed and updated.   Physical Exam: Blood pressure 138/90, pulse 68, temperature 98.3 F (36.8 C), temperature source Oral, resp. rate 18, height 5' 3"  (1.6 m), weight 124 lb 14.4 oz (56.7 kg), SpO2 100 %. ECOG: 1 General appearance: alert and cooperative appeared without distress. Head: Normocephalic, without obvious abnormality Neck: no adenopathy.  Lymph nodes: Cervical, supraclavicular, and axillary nodes normal. Heart:regular rate and rhythm, S1, S2 normal, no murmur, click, rub or gallop Lung:chest clear, no wheezing, rales, normal symmetric air entry.  Abdomin: soft, non-tender, without masses or organomegaly EXT:no erythema, induration, or nodules. Edema resolved on the right.   Lab Results: Lab Results  Component Value Date   WBC 8.6 03/18/2016   HGB  12.6 03/18/2016   HCT 38.7 03/18/2016   MCV 92.6 03/18/2016   PLT 257 03/18/2016     Chemistry      Component Value Date/Time   NA 145  03/18/2016 1457   K 4.6 03/18/2016 1457   CL 104 06/20/2014 1051   CO2 30 (H) 03/18/2016 1457   BUN 11.7 03/18/2016 1457   CREATININE 0.8 03/18/2016 1457      Component Value Date/Time   CALCIUM 10.2 03/18/2016 1457   ALKPHOS 91 03/18/2016 1457   AST 14 03/18/2016 1457   ALT 9 03/18/2016 1457   BILITOT 0.33 03/18/2016 1457       Radiological Studies:  Impression and Plan:   76 year old woman with the following issues:  1.Superficial spreading melanoma diagnosed in 2016. She underwent a wide excision of the right heel which showed a 1.7 mm superficial spreading melanoma with clinical stage T2aN0. She underwent repeat wide excision and sentinel lymph node biopsy in August 2016 which showed no residual melanoma at that time.  She developed recurrent disease with a biopsy-proven right inguinal adenopathy on 02/13/2016.  Her case was discussed in the melanoma multidisciplinary tumor board. Her PET scan and MRI did not suggest any evidence of residual disease. The abnormal lesion on her left neck have been biopsied in the past and was not melanoma.  Options of therapy were reviewed today with the patient which include systemic therapy utilizing immunotherapy versus close observation and surveillance. We have recommended continued observation for the time being and follow her clinically. Systemic therapy can be used she develops progression of disease. We will continue on strict surveillance including physical examination and laboratory testing every 3 months with imaging studies every 6 months.  2. Right-sided hip pain: Status post right hip replacement. No malignancy noted on pathology specimens.  3. Follow-up: Will be in 3 months to follow her progress.    Zola Button, MD 4/26/20181:30 PM

## 2016-05-15 NOTE — Telephone Encounter (Signed)
Gave patient AVS and calender per 4/26 los.  

## 2016-08-15 ENCOUNTER — Other Ambulatory Visit (HOSPITAL_BASED_OUTPATIENT_CLINIC_OR_DEPARTMENT_OTHER): Payer: Managed Care, Other (non HMO)

## 2016-08-15 ENCOUNTER — Telehealth: Payer: Self-pay | Admitting: Oncology

## 2016-08-15 ENCOUNTER — Ambulatory Visit (HOSPITAL_BASED_OUTPATIENT_CLINIC_OR_DEPARTMENT_OTHER): Payer: Managed Care, Other (non HMO) | Admitting: Oncology

## 2016-08-15 VITALS — BP 151/77 | HR 72 | Temp 98.7°F | Resp 18 | Ht 63.0 in | Wt 135.1 lb

## 2016-08-15 DIAGNOSIS — I890 Lymphedema, not elsewhere classified: Secondary | ICD-10-CM

## 2016-08-15 DIAGNOSIS — C773 Secondary and unspecified malignant neoplasm of axilla and upper limb lymph nodes: Secondary | ICD-10-CM

## 2016-08-15 DIAGNOSIS — C4371 Malignant melanoma of right lower limb, including hip: Secondary | ICD-10-CM

## 2016-08-15 DIAGNOSIS — C439 Malignant melanoma of skin, unspecified: Secondary | ICD-10-CM

## 2016-08-15 LAB — CBC WITH DIFFERENTIAL/PLATELET
BASO%: 0.5 % (ref 0.0–2.0)
Basophils Absolute: 0 10*3/uL (ref 0.0–0.1)
EOS%: 2 % (ref 0.0–7.0)
Eosinophils Absolute: 0.1 10*3/uL (ref 0.0–0.5)
HEMATOCRIT: 41.6 % (ref 34.8–46.6)
HGB: 13.7 g/dL (ref 11.6–15.9)
LYMPH#: 1.5 10*3/uL (ref 0.9–3.3)
LYMPH%: 29.1 % (ref 14.0–49.7)
MCH: 30.4 pg (ref 25.1–34.0)
MCHC: 32.9 g/dL (ref 31.5–36.0)
MCV: 92.4 fL (ref 79.5–101.0)
MONO#: 0.4 10*3/uL (ref 0.1–0.9)
MONO%: 7.8 % (ref 0.0–14.0)
NEUT%: 60.6 % (ref 38.4–76.8)
NEUTROS ABS: 3.2 10*3/uL (ref 1.5–6.5)
PLATELETS: 218 10*3/uL (ref 145–400)
RBC: 4.5 10*6/uL (ref 3.70–5.45)
RDW: 16 % — AB (ref 11.2–14.5)
WBC: 5.3 10*3/uL (ref 3.9–10.3)

## 2016-08-15 LAB — COMPREHENSIVE METABOLIC PANEL
ALT: 12 U/L (ref 0–55)
ANION GAP: 8 meq/L (ref 3–11)
AST: 18 U/L (ref 5–34)
Albumin: 4.3 g/dL (ref 3.5–5.0)
Alkaline Phosphatase: 103 U/L (ref 40–150)
BILIRUBIN TOTAL: 0.31 mg/dL (ref 0.20–1.20)
BUN: 14.2 mg/dL (ref 7.0–26.0)
CO2: 27 meq/L (ref 22–29)
CREATININE: 0.8 mg/dL (ref 0.6–1.1)
Calcium: 9.9 mg/dL (ref 8.4–10.4)
Chloride: 106 mEq/L (ref 98–109)
EGFR: 79 mL/min/{1.73_m2} — ABNORMAL LOW (ref >90)
Glucose: 107 mg/dl (ref 70–140)
Potassium: 4.4 mEq/L (ref 3.5–5.1)
Sodium: 141 mEq/L (ref 136–145)
TOTAL PROTEIN: 7.4 g/dL (ref 6.4–8.3)

## 2016-08-15 LAB — LACTATE DEHYDROGENASE: LDH: 265 U/L — ABNORMAL HIGH (ref 125–245)

## 2016-08-15 NOTE — Telephone Encounter (Signed)
Gave patient avs report and appointments for October. Central radiology will call re scan.  °

## 2016-08-15 NOTE — Progress Notes (Signed)
Hematology and Oncology Follow Up Visit  Michaela Jefferson [id number] 07-11-1940 76 y.o. 08/15/2016 1:21 PM [name], MDSmith, Kittie Plater, MD   Principle Diagnosis: 76 year old woman with superficial spreading melanoma diagnosed in 2016. She presented with a 1.7 mm lesion on her right heel. She subsequently developed right inguinal lymph node metastasis biopsy-proven in January 2018. BRAF status is pending.   Prior Therapy: On July 2016 she underwent excision of a right heel melanoma done by Dr. Marla Roe and the pathology showed malignant melanoma superficial subtype associated with melanoma in situ. At that time the measurement was 1.7 mm pathological staging was T2a. Sentinel lymph node biopsy was negative for advanced.  She developed a right inguinal mass and underwent a biopsy on 02/13/2016 confirmed the presence of metastatic melanoma.  Current therapy: Surveillance and surveillance.   Interim History: Michaela Jefferson presents today for a follow-up visit. Since the last visit, she reports no major changes in her health. She continues to recover reasonably well from her hip surgery as well as her lymph node dissection. She does have residual lymphedema which is improving at this time. Her mobility have nearly returned to normal and continues to attends to activities of daily living. She is eating well and continues to work part time. She does not report any skin lesions or masses. Her performance status remains excellent.   She does not report any headaches, blurry vision, syncope or seizures. She does not report any fevers, chills, sweats or weight loss. She does not report any chest pain, palpitation, orthopnea or leg edema. She does not report any cough, wheezing or hemoptysis. She does not report any nausea, vomiting or abdominal pain. She does not report any constipation, diarrhea or change in her bowel habits. She does not report any frequency urgency or hesitancy. She does not  report any skeletal complaints. Remaining review of systems unremarkable.   Medications: I have reviewed the patient's current medications.  Current Outpatient Prescriptions  Medication Sig Dispense Refill  . diphenhydrAMINE (BENADRYL) 25 MG tablet Take one tablet every 4 hours for lip swelling.    Marland Kitchen HYDROcodone-acetaminophen (NORCO) 5-325 MG tablet Take 1-2 tablets by mouth every 4 (four) hours as needed for moderate pain or severe pain. 20 tablet 0  . pantoprazole (PROTONIX) 40 MG tablet Take 40 mg by mouth daily.     No current facility-administered medications for this visit.      Allergies:  Allergies  Allergen Reactions  . Sulfa Antibiotics Other (See Comments)    Patient stated,"I haven't had since childhood and I don't know what kind of reaction I have."  . Sulfonamide Derivatives Other (See Comments)    Patient stated,"I haven't had since childhood and I don't know what kind of reaction I have."    Past Medical History, Surgical history, Social history, and Family History were reviewed and updated.   Physical Exam: Blood pressure (!) 151/77, pulse 72, temperature 98.7 F (37.1 C), temperature source Oral, resp. rate 18, height _0  (1.6 m), weight 135 lb 1.6 oz (61.3 kg), SpO2 98 %. ECOG: 1 General appearance: well-appearing woman without distress. Head: Normocephalic, without obvious abnormality no oral thrush or ulcers. Neck: no adenopathy.  Lymph nodes: Cervical, supraclavicular, and axillary nodes normal. Heart:regular rate and rhythm, S1, S2 normal, no murmur, click, rub or gallop Lung:chest clear, no wheezing, rales, normal symmetric air entry.  Abdomin: soft, non-tender, without masses or organomegaly EXT: No edema noted on the right side.  Lab Results: Lab Results  Component Value Date   WBC 5.3 08/15/2016   HGB 13.7 08/15/2016   HCT 41.6 08/15/2016   MCV 92.4 08/15/2016   PLT 218 08/15/2016     Chemistry      Component Value Date/Time   NA 145  03/18/2016 1457   K 4.6 03/18/2016 1457   CL 104 06/20/2014 1051   CO2 30 (H) 03/18/2016 1457   BUN 11.7 03/18/2016 1457   CREATININE 0.8 03/18/2016 1457      Component Value Date/Time   CALCIUM 10.2 03/18/2016 1457   ALKPHOS 91 03/18/2016 1457   AST 14 03/18/2016 1457   ALT 9 03/18/2016 1457   BILITOT 0.33 03/18/2016 1457       Radiological Studies:  Impression and Plan:   76 year old woman with the following issues:  1.Superficial spreading melanoma diagnosed in 2016. She underwent a wide excision of the right heel which showed a 1.7 mm superficial spreading melanoma with clinical stage T2aN0. She underwent repeat wide excision and sentinel lymph node biopsy in August 2016 which showed no residual melanoma at that time.  She developed recurrent disease with a biopsy-proven right inguinal adenopathy on 02/13/2016.  Her case was discussed in the melanoma multidisciplinary tumor board. Her PET scan and MRI did not suggest any evidence of residual disease.   The option of using systemic therapy versus observation and surveillance discussed with the patient again and the plan is to continue to observe her and institute systemic therapy if she develops measurable disease. She will have a repeat PET scan in 3 months.  2. Right-sided hip pain: Status post right hip replacement. No malignancy noted on pathology specimens.  3. Follow-up: Will be in 3 months after a PET scan.    Zola Button, MD 7/27/20181:21 PM

## 2016-09-10 ENCOUNTER — Telehealth: Payer: Self-pay | Admitting: Oncology

## 2016-09-10 NOTE — Telephone Encounter (Signed)
Call day 10/31 earlier appointment left patient a message to call with questions or concerns

## 2016-09-16 ENCOUNTER — Telehealth: Payer: Self-pay

## 2016-09-16 NOTE — Telephone Encounter (Signed)
Pt called for time for lab on 10/29. Time given. She asked about PET scan. It is ordered. Explained about the 30 day window for prior authorization. She said she will call Oct 1 to inquire about the PA for her PET

## 2016-10-27 ENCOUNTER — Telehealth: Payer: Self-pay | Admitting: *Deleted

## 2016-10-27 NOTE — Telephone Encounter (Signed)
Returned patient's phone call regarding PET scan authorization and getting it scheduled. I instructed patient to call Central Scheduling, [phone], and schedule the scan. Once scheduled, [facility] Care will work on the prior authorization. Patient verbalized understanding.

## 2016-11-17 ENCOUNTER — Other Ambulatory Visit (HOSPITAL_BASED_OUTPATIENT_CLINIC_OR_DEPARTMENT_OTHER): Payer: Managed Care, Other (non HMO)

## 2016-11-17 ENCOUNTER — Ambulatory Visit (HOSPITAL_COMMUNITY)
Admission: RE | Admit: 2016-11-17 | Discharge: 2016-11-17 | Disposition: A | Discharge status: Home / Self Care | Payer: Managed Care, Other (non HMO) | Source: Ambulatory Visit | Attending: Oncology | Admitting: Oncology

## 2016-11-17 DIAGNOSIS — C773 Secondary and unspecified malignant neoplasm of axilla and upper limb lymph nodes: Secondary | ICD-10-CM

## 2016-11-17 DIAGNOSIS — C439 Malignant melanoma of skin, unspecified: Secondary | ICD-10-CM | POA: Insufficient documentation

## 2016-11-17 DIAGNOSIS — C4371 Malignant melanoma of right lower limb, including hip: Secondary | ICD-10-CM | POA: Diagnosis not present

## 2016-11-17 DIAGNOSIS — R911 Solitary pulmonary nodule: Secondary | ICD-10-CM | POA: Diagnosis not present

## 2016-11-17 DIAGNOSIS — I700 Atherosclerosis of aorta: Secondary | ICD-10-CM | POA: Diagnosis not present

## 2016-11-17 DIAGNOSIS — I2510 Atherosclerotic heart disease of native coronary artery without angina pectoris: Secondary | ICD-10-CM | POA: Diagnosis not present

## 2016-11-17 DIAGNOSIS — K449 Diaphragmatic hernia without obstruction or gangrene: Secondary | ICD-10-CM | POA: Diagnosis not present

## 2016-11-17 LAB — COMPREHENSIVE METABOLIC PANEL
ALBUMIN: 3.9 g/dL (ref 3.5–5.0)
ALK PHOS: 72 U/L (ref 40–150)
ALT: 12 U/L (ref 0–55)
AST: 17 U/L (ref 5–34)
Anion Gap: 8 mEq/L (ref 3–11)
BILIRUBIN TOTAL: 0.31 mg/dL (ref 0.20–1.20)
BUN: 13.9 mg/dL (ref 7.0–26.0)
CO2: 25 mEq/L (ref 22–29)
Calcium: 9.4 mg/dL (ref 8.4–10.4)
Chloride: 108 mEq/L (ref 98–109)
Creatinine: 0.8 mg/dL (ref 0.6–1.1)
EGFR: >60 mL/min/{1.73_m2} (ref >60)
GLUCOSE: 111 mg/dL (ref 70–140)
Potassium: 4.2 mEq/L (ref 3.5–5.1)
SODIUM: 141 meq/L (ref 136–145)
TOTAL PROTEIN: 6.7 g/dL (ref 6.4–8.3)

## 2016-11-17 LAB — CBC WITH DIFFERENTIAL/PLATELET
BASO%: 0.3 % (ref 0.0–2.0)
BASOS ABS: 0 10*3/uL (ref 0.0–0.1)
EOS ABS: 0.1 10*3/uL (ref 0.0–0.5)
EOS%: 1.5 % (ref 0.0–7.0)
HEMATOCRIT: 40.5 % (ref 34.8–46.6)
HEMOGLOBIN: 13.2 g/dL (ref 11.6–15.9)
LYMPH%: 16.7 % (ref 14.0–49.7)
MCH: 31.1 pg (ref 25.1–34.0)
MCHC: 32.6 g/dL (ref 31.5–36.0)
MCV: 95.3 fL (ref 79.5–101.0)
MONO#: 0.4 10*3/uL (ref 0.1–0.9)
MONO%: 6.1 % (ref 0.0–14.0)
NEUT%: 75.4 % (ref 38.4–76.8)
NEUTROS ABS: 5.2 10*3/uL (ref 1.5–6.5)
PLATELETS: 228 10*3/uL (ref 145–400)
RBC: 4.25 10*6/uL (ref 3.70–5.45)
RDW: 14.4 % (ref 11.2–14.5)
WBC: 6.9 10*3/uL (ref 3.9–10.3)
lymph#: 1.2 10*3/uL (ref 0.9–3.3)

## 2016-11-17 LAB — GLUCOSE, CAPILLARY: Glucose-Capillary: 114 mg/dL — ABNORMAL HIGH (ref 65–99)

## 2016-11-17 LAB — LACTATE DEHYDROGENASE: LDH: 263 U/L — ABNORMAL HIGH (ref 125–245)

## 2016-11-17 MED ORDER — FLUDEOXYGLUCOSE F - 18 (FDG) INJECTION
6.7700 | Freq: Once | INTRAVENOUS | Status: AC | PRN
  Administered 2016-11-17: 6.77 via INTRAVENOUS

## 2016-11-19 ENCOUNTER — Telehealth: Payer: Self-pay

## 2016-11-19 ENCOUNTER — Ambulatory Visit (HOSPITAL_BASED_OUTPATIENT_CLINIC_OR_DEPARTMENT_OTHER): Payer: Managed Care, Other (non HMO) | Admitting: Oncology

## 2016-11-19 VITALS — BP 118/98 | HR 67 | Temp 98.3°F | Resp 18 | Ht 63.0 in | Wt 137.4 lb

## 2016-11-19 DIAGNOSIS — C4371 Malignant melanoma of right lower limb, including hip: Secondary | ICD-10-CM

## 2016-11-19 DIAGNOSIS — C439 Malignant melanoma of skin, unspecified: Secondary | ICD-10-CM

## 2016-11-19 DIAGNOSIS — C773 Secondary and unspecified malignant neoplasm of axilla and upper limb lymph nodes: Secondary | ICD-10-CM | POA: Diagnosis not present

## 2016-11-19 DIAGNOSIS — M25552 Pain in left hip: Secondary | ICD-10-CM

## 2016-11-19 NOTE — Progress Notes (Signed)
Hematology and Oncology Follow Up Visit  Michaela Jefferson [id number] 08-Jul-1940 76 y.o. 11/19/2016 10:21 AM System, Pcp Not InSmith, Michaela Plater, MD   Principle Diagnosis: 76 year old woman with superficial spreading melanoma diagnosed in 2016. She presented with a 1.7 mm lesion on her right heel. She subsequently developed right inguinal lymph node metastasis biopsy-proven in January 2018.    Prior Therapy: In July 2016 she underwent excision of a right heel melanoma done by Dr. Marla Roe and the pathology showed malignant melanoma superficial subtype associated with melanoma in situ. At that time the measurement was 1.7 mm pathological staging was T2a. Sentinel lymph node biopsy was negative for diease.  She developed a right inguinal mass and underwent a biopsy on 02/13/2016 confirmed the presence of metastatic melanoma.  Current therapy: Surveillance and surveillance.   Interim History: Michaela Jefferson presents today for a follow-up visit. Since the last visit, she continues to feel well without any issues. She does have residual lymphedema in her right lower extremity which is improving at this time. Her mobility have nearly returned to normal and continues to attends to activities of daily living. She is eating well and continues to work part time. She does not report any skin lesions or masses. Her performance status remains excellent.  She does report left-sided hip pain which likely will be replaced in the next few months.   She does not report any headaches, blurry vision, syncope or seizures. She does not report any fevers, chills, sweats or weight loss. She does not report any chest pain, palpitation, orthopnea or leg edema. She does not report any cough, wheezing or hemoptysis. She does not report any nausea, vomiting or abdominal pain. She does not report any constipation, diarrhea or change in her bowel habits. She does not report any frequency urgency or hesitancy. She does not report  any skeletal complaints. Remaining review of systems unremarkable.   Medications: I have reviewed the patient's current medications.  Current Outpatient Prescriptions  Medication Sig Dispense Refill  . diphenhydrAMINE (BENADRYL) 25 MG tablet Take one tablet every 4 hours for lip swelling.    Marland Kitchen HYDROcodone-acetaminophen (NORCO) 5-325 MG tablet Take 1-2 tablets by mouth every 4 (four) hours as needed for moderate pain or severe pain. 20 tablet 0  . pantoprazole (PROTONIX) 40 MG tablet Take 40 mg by mouth daily.     No current facility-administered medications for this visit.      Allergies:  Allergies  Allergen Reactions  . Sulfa Antibiotics Other (See Comments)    Patient stated,"I haven't had since childhood and I don't know what kind of reaction I have."  . Sulfonamide Derivatives Other (See Comments)    Patient stated,"I haven't had since childhood and I don't know what kind of reaction I have."    Past Medical History, Surgical history, Social history, and Family History were reviewed and updated.   Physical Exam: Blood pressure (!) 118/98, pulse 67, temperature 98.3 F (36.8 C), temperature source Oral, resp. rate 18, height 5\' 3"  (1.6 m), weight 137 lb 6.4 oz (62.3 kg), SpO2 100 %. ECOG: 1 General appearance: Well-appearing woman without distress. Head: Normocephalic, without obvious abnormality no oral or ulcers. Neck: no adenopathy.  Lymph nodes: Cervical, supraclavicular, and axillary nodes normal. Heart:regular rate and rhythm, S1, S2 normal, no murmur, click, rub or gallop Lung:chest clear, no wheezing, rales, normal symmetric air entry.  Abdomin: soft, non-tender, without masses or organomegaly no shifting dullness or ascites. EXT: No edema noted on the right side.  Lab Results: Lab Results  Component Value Date   WBC 6.9 11/17/2016   HGB 13.2 11/17/2016   HCT 40.5 11/17/2016   MCV 95.3 11/17/2016   PLT 228 11/17/2016     Chemistry      Component Value  Date/Time   NA 141 11/17/2016 0951   K 4.2 11/17/2016 0951   CL 104 06/20/2014 1051   CO2 25 11/17/2016 0951   BUN 13.9 11/17/2016 0951   CREATININE 0.8 11/17/2016 0951      Component Value Date/Time   CALCIUM 9.4 11/17/2016 0951   ALKPHOS 72 11/17/2016 0951   AST 17 11/17/2016 0951   ALT 12 11/17/2016 0951   BILITOT 0.31 11/17/2016 0951       Radiological Studies:  EXAM: NUCLEAR MEDICINE PET WHOLE BODY  TECHNIQUE: 6.8 mCi F-18 FDG was injected intravenously. Full-ring PET imaging was performed from the vertex to the feet after the radiotracer. CT data was obtained and used for attenuation correction and anatomic localization.  FASTING BLOOD GLUCOSE:  Value: 114 mg/dl  COMPARISON:  03/18/2016 PET-CT.  FINDINGS: HEAD/NECK  No hypermetabolic activity in the scalp. No hypermetabolic cervical lymph nodes.  CHEST  No enlarged or hypermetabolic axillary, mediastinal or hilar lymph nodes. Coronary artery calcifications are present. Atherosclerotic nonaneurysmal thoracic aorta. No pneumothorax. No pleural effusions. Stable 0.8 cm ground-glass left upper lobe pulmonary nodule (series 8/image 27). No acute consolidative airspace disease, lung masses or new significant pulmonary nodules.  ABDOMEN/PELVIS  There is low-level hypermetabolism (max SUV 3.8) at the site of the right inguinal lymph node biopsy associated with a 1.9 x 1.2 cm residual hematoma adjacent to the surgical clips (series 4/image 200), compared to 5.7 x 5.4 cm with max SUV 4.3 on the 03/18/2016 PET-CT, substantially decreased in size and slightly decreased in metabolism.  No abnormal hypermetabolic activity within the liver, pancreas, adrenal glands, or spleen. No hypermetabolic lymph nodes in the abdomen or pelvis. Small hiatal hernia. Atherosclerotic nonaneurysmal abdominal aorta. Hysterectomy. Stable non hypermetabolic 1.5 cm left adnexal cyst, considered benign.  SKELETON  No  focal hypermetabolic activity to suggest skeletal metastasis.  EXTREMITIES  No abnormal hypermetabolic activity in the lower extremities.  IMPRESSION: 1. No findings suspicious for recurrent hypermetabolic metastatic disease on whole-body FDG-PET/CT. 2. Right inguinal lymph node biopsy site hematoma is substantially decreased in size. Low-level hypermetabolism associated with this hematoma is slightly decreased in the interval and most compatible with mild inflammatory uptake. 3. Left upper lobe 0.8 cm ground-glass pulmonary nodule, for which 8 month stability has been demonstrated. Follow-up noncontrast chest CT is recommended every 2 years until 5 years of stability has been established. This recommendation follows the consensus statement: Guidelines for Management of Incidental Pulmonary Nodules Detected on CT Images:From the Fleischner Society 2017; published online before print (10.1148/radiol.[id number]). 4. Chronic findings include: Aortic Atherosclerosis (ICD10-I70.0). Coronary atherosclerosis. Small hiatal hernia.    Impression and Plan:   77 year old woman with the following issues:  1.Superficial spreading melanoma diagnosed in 2016. She underwent a wide excision of the right heel which showed a 1.7 mm superficial spreading melanoma with clinical stage T2aN0. She underwent repeat wide excision and sentinel lymph node biopsy in August 2016 which showed no residual melanoma at that time.  She developed recurrent disease with a biopsy-proven right inguinal adenopathy on 02/13/2016.  Risks and benefits of adjuvant immunotherapy has been reviewed with her in the past and she elected to proceed with observation and surveillance.  PET/CT scan obtained on February 18, 2016 showed  no evidence of residual disease.  Plan is to continue with active surveillance at this time and repeat PET scan in 6-8 months.  2. Hip pain: Status post right hip replacement. No malignancy  noted on pathology specimens.  She is scheduled to have left side hip replacement in the future.  3. Follow-up: Will be in 4 months to follow her progress.    Zola Button, MD 10/31/201810:21 AM

## 2016-11-19 NOTE — Telephone Encounter (Signed)
Printed avs and calender for upcoming appointment in [place]. Per 10/31 los

## 2017-03-18 ENCOUNTER — Telehealth: Payer: Self-pay

## 2017-03-18 ENCOUNTER — Inpatient Hospital Stay: Payer: Managed Care, Other (non HMO) | Attending: Oncology | Admitting: Oncology

## 2017-03-18 ENCOUNTER — Inpatient Hospital Stay: Payer: Managed Care, Other (non HMO)

## 2017-03-18 VITALS — BP 147/79 | HR 79 | Temp 98.4°F | Resp 18 | Ht 63.0 in | Wt 136.8 lb

## 2017-03-18 DIAGNOSIS — C4371 Malignant melanoma of right lower limb, including hip: Secondary | ICD-10-CM | POA: Diagnosis present

## 2017-03-18 DIAGNOSIS — C775 Secondary and unspecified malignant neoplasm of intrapelvic lymph nodes: Secondary | ICD-10-CM | POA: Diagnosis not present

## 2017-03-18 DIAGNOSIS — C439 Malignant melanoma of skin, unspecified: Secondary | ICD-10-CM

## 2017-03-18 DIAGNOSIS — M1990 Unspecified osteoarthritis, unspecified site: Secondary | ICD-10-CM | POA: Diagnosis not present

## 2017-03-18 DIAGNOSIS — Z79899 Other long term (current) drug therapy: Secondary | ICD-10-CM

## 2017-03-18 LAB — CBC WITH DIFFERENTIAL/PLATELET
BASOS ABS: 0 10*3/uL (ref 0.0–0.1)
Basophils Relative: 0 %
EOS PCT: 4 %
Eosinophils Absolute: 0.3 10*3/uL (ref 0.0–0.5)
HEMATOCRIT: 36.5 % (ref 34.8–46.6)
Hemoglobin: 12.1 g/dL (ref 11.6–15.9)
LYMPHS ABS: 1.5 10*3/uL (ref 0.9–3.3)
LYMPHS PCT: 20 %
MCH: 31.3 pg (ref 25.1–34.0)
MCHC: 33 g/dL (ref 31.5–36.0)
MCV: 94.8 fL (ref 79.5–101.0)
MONO ABS: 0.6 10*3/uL (ref 0.1–0.9)
MONOS PCT: 7 %
NEUTROS ABS: 5.2 10*3/uL (ref 1.5–6.5)
Neutrophils Relative %: 69 %
PLATELETS: 352 10*3/uL (ref 145–400)
RBC: 3.85 MIL/uL (ref 3.70–5.45)
RDW: 13.5 % (ref 11.2–14.5)
WBC: 7.6 10*3/uL (ref 3.9–10.3)

## 2017-03-18 LAB — COMPREHENSIVE METABOLIC PANEL
ALBUMIN: 3.6 g/dL (ref 3.5–5.0)
ALT: 10 U/L (ref 0–55)
ANION GAP: 9 (ref 3–11)
AST: 13 U/L (ref 5–34)
Alkaline Phosphatase: 89 U/L (ref 40–150)
BILIRUBIN TOTAL: 0.3 mg/dL (ref 0.2–1.2)
BUN: 16 mg/dL (ref 7–26)
CO2: 27 mmol/L (ref 22–29)
Calcium: 10 mg/dL (ref 8.4–10.4)
Chloride: 104 mmol/L (ref 98–109)
Creatinine, Ser: 0.81 mg/dL (ref 0.60–1.10)
GFR calc Af Amer: >60 mL/min (ref >60)
GFR calc non Af Amer: >60 mL/min (ref >60)
GLUCOSE: 92 mg/dL (ref 70–140)
POTASSIUM: 4.8 mmol/L (ref 3.5–5.1)
SODIUM: 140 mmol/L (ref 136–145)
TOTAL PROTEIN: 7.2 g/dL (ref 6.4–8.3)

## 2017-03-18 LAB — LACTATE DEHYDROGENASE: LDH: 271 U/L — ABNORMAL HIGH (ref 125–245)

## 2017-03-18 NOTE — Telephone Encounter (Signed)
Printed avs and calender of upcoming appointment. Per 2/27 los 

## 2017-03-18 NOTE — Progress Notes (Signed)
Hematology and Oncology Follow Up Visit  Michaela Jefferson [id number] [birth year] 77 y.o. 03/18/2017 10:17 AM System, Pcp Not InNo ref. provider found   Principle Diagnosis: 77 year old woman with T2 N0 superficial spreading melanoma diagnosed in 2016. She presented with a 1.7 mm lesion on her right heel. She developed right inguinal lymph node metastasis biopsy-proven in January 2018.    Prior Therapy: In July 2016 she underwent excision of a right heel melanoma done by Dr. Marla Roe and the pathology showed malignant melanoma superficial subtype associated with melanoma in situ.  He had T2N0 disease at the time.  She developed a right inguinal mass and underwent a biopsy on 02/13/2016 confirmed the presence of metastatic melanoma.  Current therapy: Surveillance and surveillance.   Interim History: Michaela Jefferson is here for a follow-up.  Last visit, she underwent a left hip replacement I was completed in the last week.  Her operation went very well and is recovering reasonably well.  She is able to bear weight at this time and ambulating slowly with the help of a cane.  She does have mild postoperative pain in the left hip but no pain on the right.  She has resumed some activities of daily living and she has been working up until the day of surgery.  She does not report any lymphadenopathy or petechiae.  She does report right ankle swelling which has improved at this time.  She denied any inguinal adenopathy or recurrent of her hematoma.  She does not report any headaches, blurry vision, syncope or seizures. She does not report any fevers, chills, sweats.  Her appetite and weight has been stable. She does not report any chest pain, palpitation, orthopnea or leg edema. She does not report any cough, wheezing or hemoptysis. She does not report any nausea, vomiting or abdominal pain. She does not report any constipation, diarrhea or change in her bowel habits. She does not report any frequency urgency or  hesitancy. She does not report any skeletal complaints.  He does not report any skin rashes or lesions.  She does not report any lymphadenopathy or petechiae.  Remaining review of systems is negative.   Medications: I have reviewed the patient's current medications.  Current Outpatient Medications  Medication Sig Dispense Refill  . diphenhydrAMINE (BENADRYL) 25 MG tablet Take one tablet every 4 hours for lip swelling.    Marland Kitchen HYDROcodone-acetaminophen (NORCO) 5-325 MG tablet Take 1-2 tablets by mouth every 4 (four) hours as needed for moderate pain or severe pain. 20 tablet 0  . pantoprazole (PROTONIX) 40 MG tablet Take 40 mg by mouth daily.     No current facility-administered medications for this visit.      Allergies:  Allergies  Allergen Reactions  . Sulfa Antibiotics Other (See Comments)    Patient stated,"I haven't had since childhood and I don't know what kind of reaction I have."  . Sulfonamide Derivatives Other (See Comments)    Patient stated,"I haven't had since childhood and I don't know what kind of reaction I have."    Past Medical History, Surgical history, Social history, and Family History were reviewed and updated.   Physical Exam:  ECOG: 1 General appearance: Alert, awake woman without distress. Head: Normocephalic, without obvious abnormality  Oral mucosa: Mucous membranes are moist and pink.  No oral thrush or ulcers. Eyes: No scleral icterus. Lymph nodes: Cervical, supraclavicular, and axillary nodes normal. Heart:regular rate and rhythm, S1, S2 normal, no murmur, click, rub or gallop Lung: Clear to auscultation without  rhonchi or wheezes or dullness to percussion. Abdomin: Soft without any rebound or guarding.  No shifting dullness or ascites. Musculoskeletal: No joint deformity or effusion.  Right ankle edema noted.  Limited range of motion on the left hip. Skin: No rashes or lesions.  Lab Results: Lab Results  Component Value Date   WBC 6.9 11/17/2016    HGB 13.2 11/17/2016   HCT 40.5 11/17/2016   MCV 95.3 11/17/2016   PLT 228 11/17/2016     Chemistry      Component Value Date/Time   NA 141 11/17/2016 0951   K 4.2 11/17/2016 0951   CL 104 06/20/2014 1051   CO2 25 11/17/2016 0951   BUN 13.9 11/17/2016 0951   CREATININE 0.8 11/17/2016 0951      Component Value Date/Time   CALCIUM 9.4 11/17/2016 0951   ALKPHOS 72 11/17/2016 0951   AST 17 11/17/2016 0951   ALT 12 11/17/2016 0951   BILITOT 0.31 11/17/2016 0951         Impression and Plan:   77 year old woman with the following issues:  1. Malignant melanoma, superficial spreading type diagnosed in 2016.  She had a T2 N0 disease and developed recurrent disease with a biopsy-proven right inguinal adenopathy on 02/13/2016.  PET/CT scan from October 2018 was reviewed again showed no evidence of metastatic disease.  Adjuvant immunotherapy was discussed with the patient on multiple occasions and she deferred that option unless she has develops measurable disease.  The natural course of this disease was reviewed again with the patient including risks and benefits of immunotherapy.  For the time being she elected to continue with observation and surveillance and institute systemic therapy if she develops disease recurrence.  2.  Osteoarthritis: Status post left hip replacement with improvement in her her pain and mobility.  She is recovering reasonably well at this time continues to follow with orthopedic surgery.  3. Follow-up: Will be in 4 months repeat imaging studies.  15  minutes was spent with the patient face-to-face today.  More than 50% of time was dedicated to patient counseling, education and coordination of the patient's multifaceted care.   Zola Button, MD 2/27/201910:17 AM

## 2017-03-27 IMAGING — US US EXTREM LOW VENOUS*R*
1 series · 13 of 24 positions shown · non-contrast
Comparison: None.

CLINICAL DATA: 74-year-old female with 6 day history of right ankle
and foot swelling. No known injury.



[Series 1: us extrem low venous*right* · 0.06mm/px · 13 of 30 slices shown]
[im 1/30]
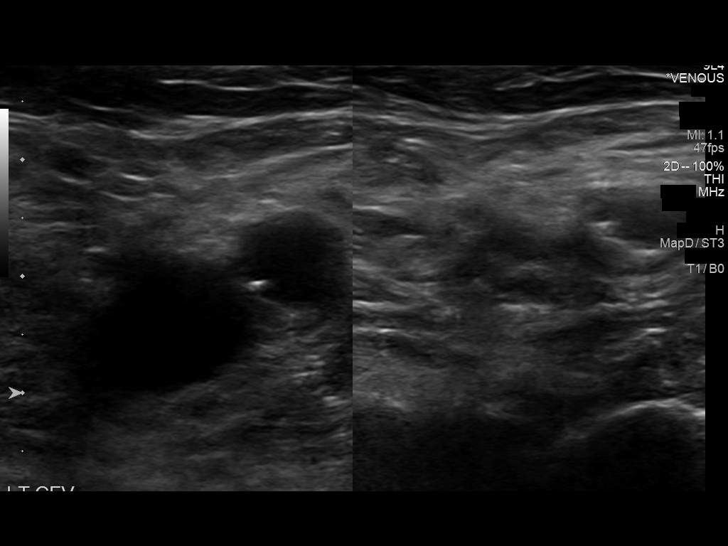
[im 3/30]
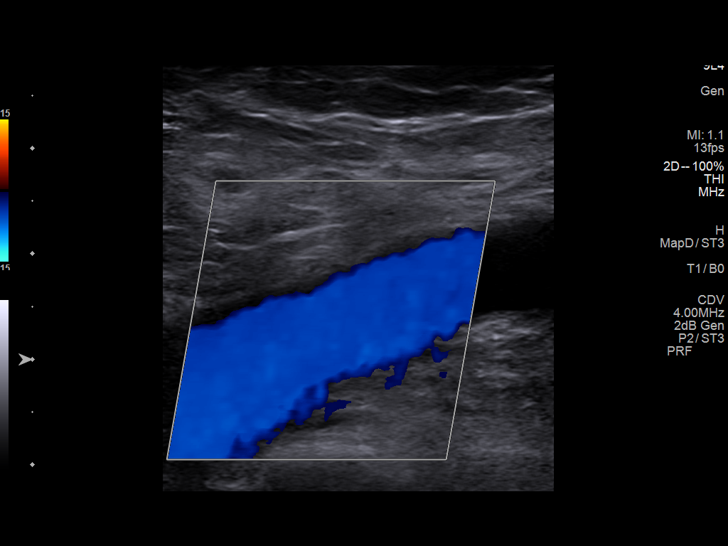
[im 6/30]
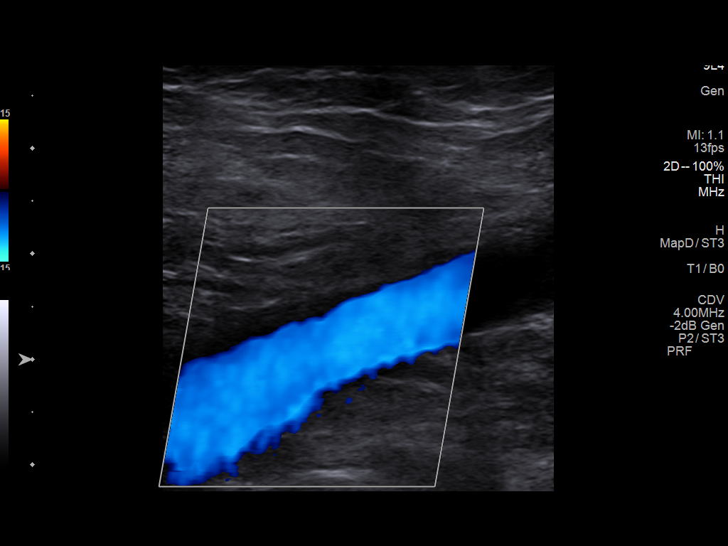
[im 8/30]
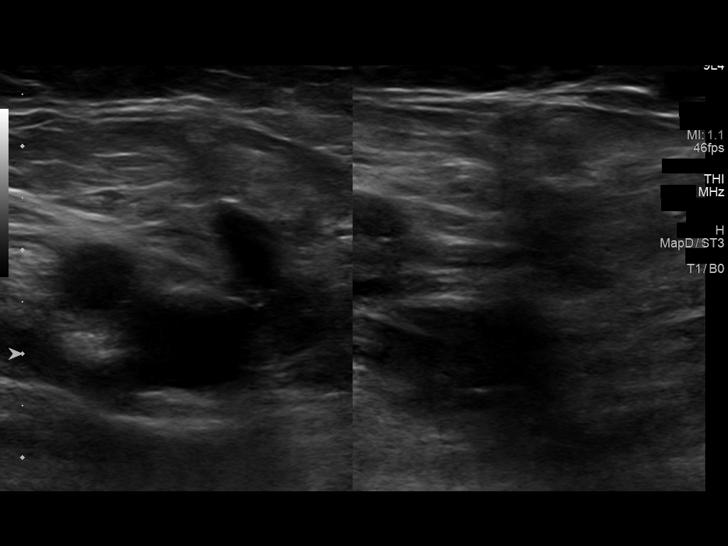
[im 11/30]
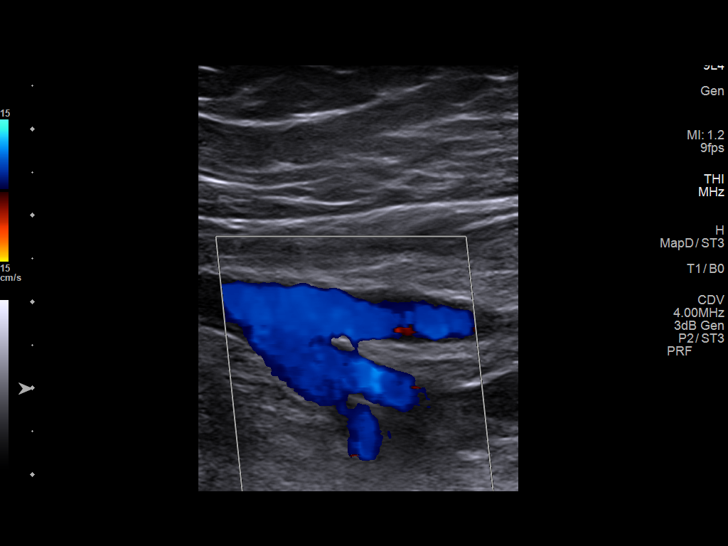
[im 13/30]
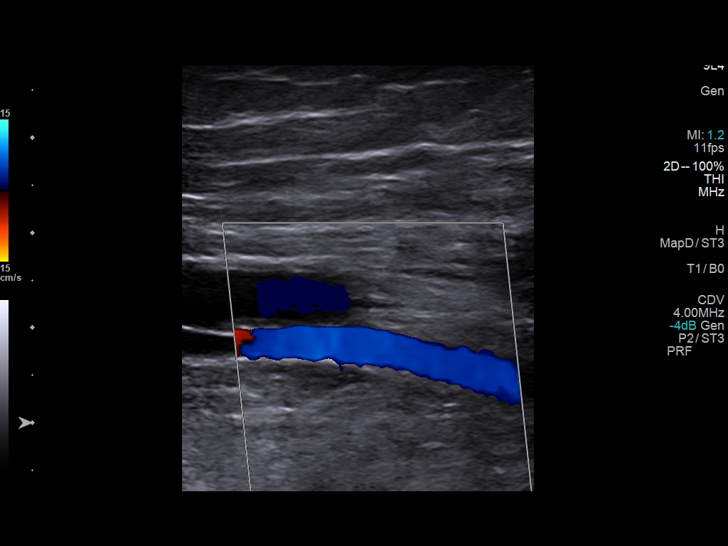
[im 16/30]
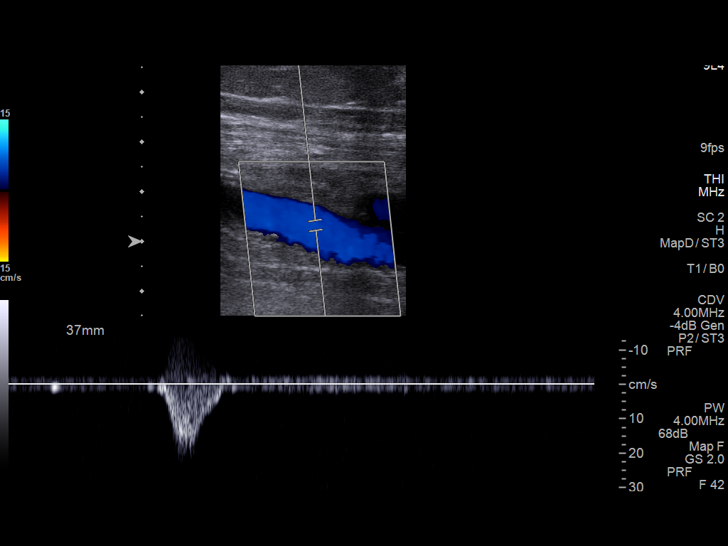
[im 17/30]
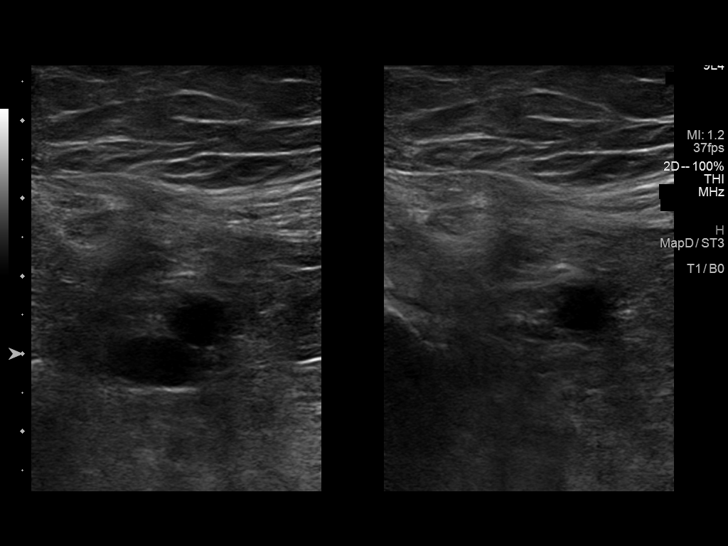
[im 19/30]
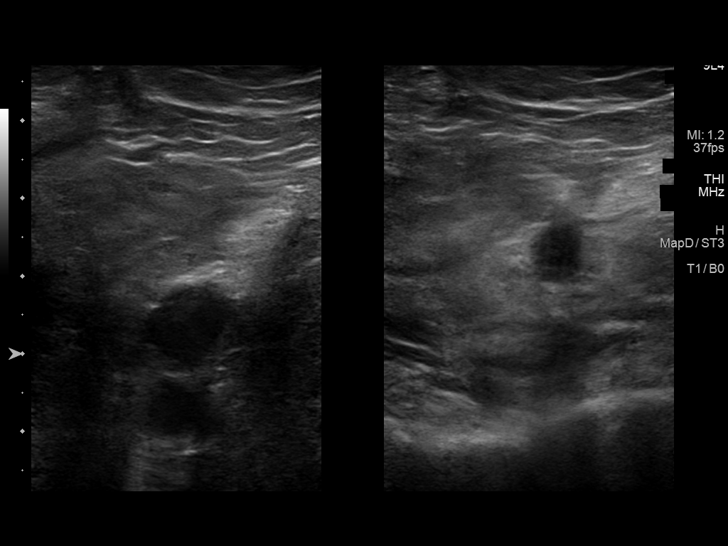
[im 22/30]
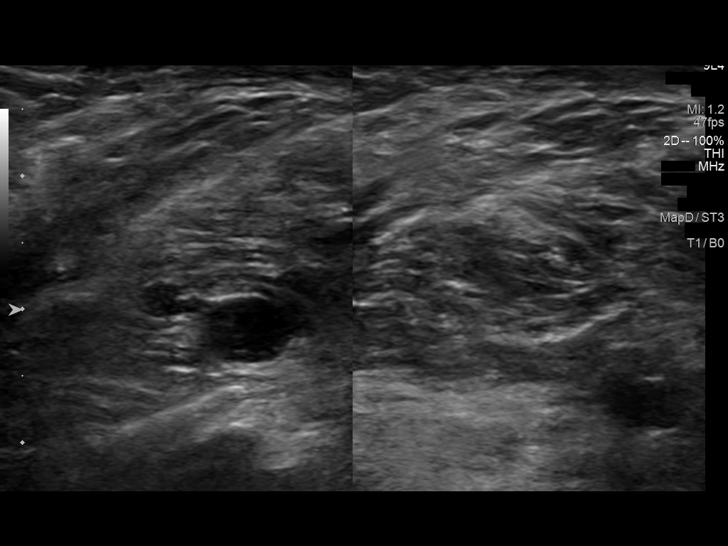
[im 24/30]
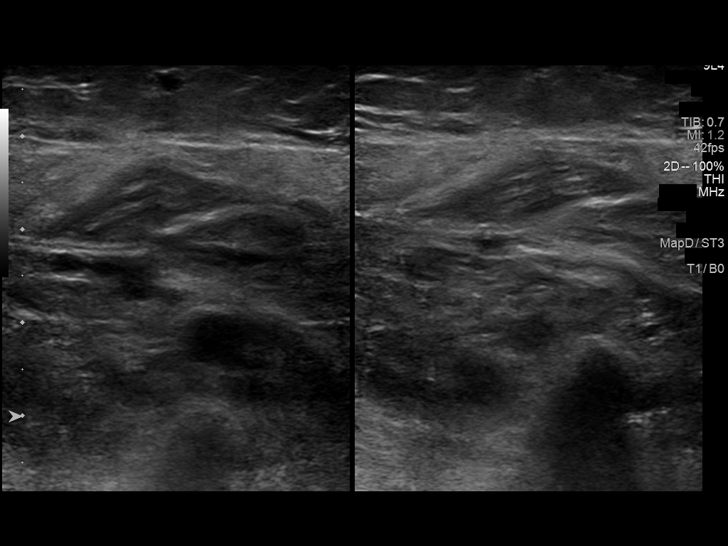
[im 27/30]
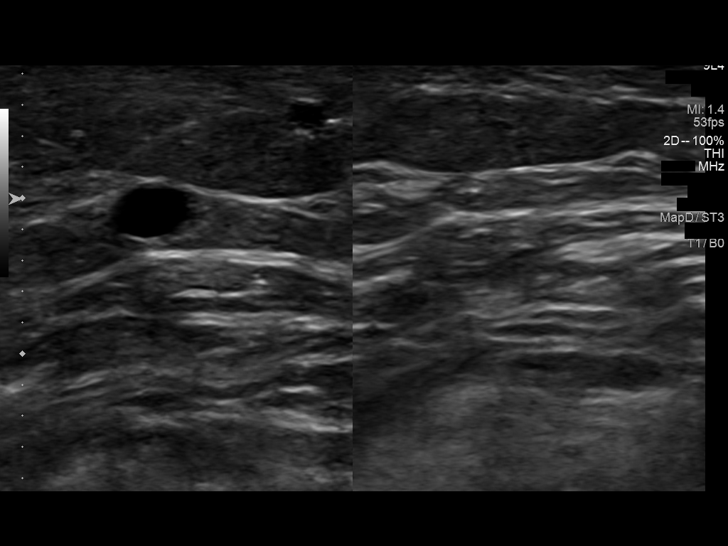
[im 30/30]
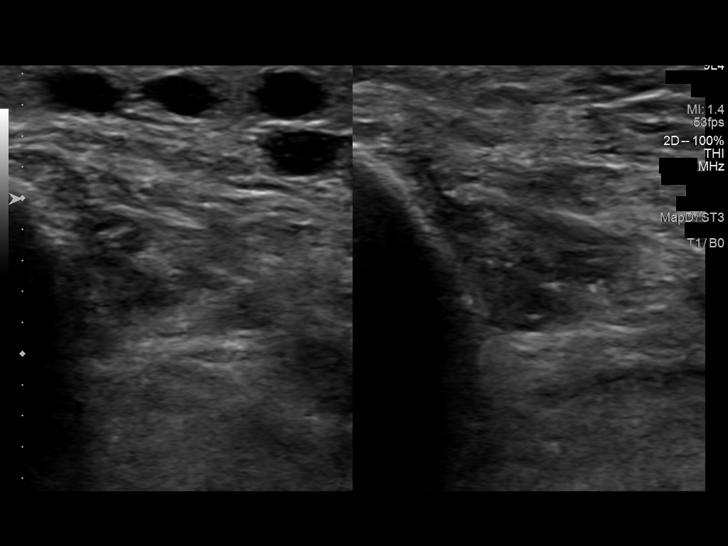

[13 of 24 positions shown; findings below may reference images not displayed]

FINDINGS: Contralateral Common Femoral Vein: Respiratory phasicity is normal
and symmetric with the symptomatic side. No evidence of thrombus.
Normal compressibility.

Common Femoral Vein: No evidence of thrombus. Normal
compressibility, respiratory phasicity and response to augmentation.

Saphenofemoral Junction: No evidence of thrombus. Normal
compressibility and flow on color Doppler imaging.

Profunda Femoral Vein: No evidence of thrombus. Normal
compressibility and flow on color Doppler imaging.

Femoral Vein: No evidence of thrombus. Normal compressibility,
respiratory phasicity and response to augmentation.

Popliteal Vein: No evidence of thrombus. Normal compressibility,
respiratory phasicity and response to augmentation.

Calf Veins: No evidence of thrombus. Normal compressibility and flow
on color Doppler imaging.

Superficial Great Saphenous Vein: No evidence of thrombus. Normal
compressibility and flow on color Doppler imaging.

Venous Reflux: Venous varicosities are noted in the right calf
suggesting underlying superficial venous insufficiency.

Other Findings:  None.
IMPRESSION: 1. No evidence of a deep venous thrombosis.
2. Enlarged great saphenous vein with evidence of superficial venous
varicosities. These findings suggest underlying superficial venous
insufficiency which can be a source for lower extremity swelling,
pain and fatigue. Consider referral to Interventional Radiology or
other venous specialist for further evaluation and management.

[phone]

## 2017-07-14 ENCOUNTER — Inpatient Hospital Stay: Payer: Managed Care, Other (non HMO) | Attending: Oncology

## 2017-07-14 DIAGNOSIS — M1990 Unspecified osteoarthritis, unspecified site: Secondary | ICD-10-CM | POA: Diagnosis not present

## 2017-07-14 DIAGNOSIS — Z85828 Personal history of other malignant neoplasm of skin: Secondary | ICD-10-CM | POA: Insufficient documentation

## 2017-07-14 DIAGNOSIS — Z79899 Other long term (current) drug therapy: Secondary | ICD-10-CM | POA: Insufficient documentation

## 2017-07-14 DIAGNOSIS — C439 Malignant melanoma of skin, unspecified: Secondary | ICD-10-CM

## 2017-07-14 LAB — CMP (CANCER CENTER ONLY)
ALBUMIN: 4.1 g/dL (ref 3.5–5.0)
ALT: 21 U/L (ref 0–44)
AST: 26 U/L (ref 15–41)
Alkaline Phosphatase: 103 U/L (ref 38–126)
Anion gap: 8 (ref 5–15)
BUN: 15 mg/dL (ref 8–23)
CHLORIDE: 105 mmol/L (ref 98–111)
CO2: 27 mmol/L (ref 22–32)
Calcium: 9.4 mg/dL (ref 8.9–10.3)
Creatinine: 0.87 mg/dL (ref 0.44–1.00)
GFR, Est AFR Am: >60 mL/min (ref >60)
GFR, Estimated: >60 mL/min (ref >60)
GLUCOSE: 107 mg/dL — AB (ref 70–99)
POTASSIUM: 4.6 mmol/L (ref 3.5–5.1)
Sodium: 140 mmol/L (ref 135–145)
Total Bilirubin: 0.3 mg/dL (ref 0.3–1.2)
Total Protein: 6.8 g/dL (ref 6.5–8.1)

## 2017-07-14 LAB — CBC WITH DIFFERENTIAL (CANCER CENTER ONLY)
BASOS PCT: 1 %
Basophils Absolute: 0 10*3/uL (ref 0.0–0.1)
EOS ABS: 0.1 10*3/uL (ref 0.0–0.5)
Eosinophils Relative: 3 %
HCT: 40.2 % (ref 34.8–46.6)
Hemoglobin: 13.4 g/dL (ref 11.6–15.9)
Lymphocytes Relative: 27 %
Lymphs Abs: 1.2 10*3/uL (ref 0.9–3.3)
MCH: 30.3 pg (ref 25.1–34.0)
MCHC: 33.4 g/dL (ref 31.5–36.0)
MCV: 90.7 fL (ref 79.5–101.0)
Monocytes Absolute: 0.4 10*3/uL (ref 0.1–0.9)
Monocytes Relative: 10 %
Neutro Abs: 2.7 10*3/uL (ref 1.5–6.5)
Neutrophils Relative %: 59 %
Platelet Count: 224 10*3/uL (ref 145–400)
RBC: 4.44 MIL/uL (ref 3.70–5.45)
RDW: 16.2 % — ABNORMAL HIGH (ref 11.2–14.5)
WBC: 4.5 10*3/uL (ref 3.9–10.3)

## 2017-07-16 ENCOUNTER — Inpatient Hospital Stay: Payer: Managed Care, Other (non HMO) | Admitting: Oncology

## 2017-07-16 ENCOUNTER — Telehealth: Payer: Self-pay | Admitting: Oncology

## 2017-07-16 VITALS — BP 143/81 | HR 70 | Temp 98.6°F | Resp 18 | Ht 63.0 in | Wt 143.5 lb

## 2017-07-16 DIAGNOSIS — M1990 Unspecified osteoarthritis, unspecified site: Secondary | ICD-10-CM

## 2017-07-16 DIAGNOSIS — C439 Malignant melanoma of skin, unspecified: Secondary | ICD-10-CM

## 2017-07-16 DIAGNOSIS — Z85828 Personal history of other malignant neoplasm of skin: Secondary | ICD-10-CM

## 2017-07-16 DIAGNOSIS — Z79899 Other long term (current) drug therapy: Secondary | ICD-10-CM | POA: Diagnosis not present

## 2017-07-16 NOTE — Progress Notes (Signed)
Hematology and Oncology Follow Up Visit  Michaela Jefferson [id number] 16-Jul-1940 77 y.o. 07/16/2017 10:23 AM System, Pcp Not InNo ref. provider found   Principle Diagnosis: 77 year old woman with melanoma of the foot diagnosed in 2016. She she had a T2N0, 1.7 mm lesion on her right heel. She developed right inguinal lymph node metastasis biopsy-proven in January 2018.    Prior Therapy: In July 2016 she underwent excision of a right heel melanoma done by Dr. Marla Roe and the pathology showed malignant melanoma superficial subtype associated with melanoma in situ.  He had T2N0 disease at the time.  She developed a right inguinal mass and underwent a biopsy on 02/13/2016 confirmed the presence of metastatic melanoma.  Current therapy: Active surveillance.   Interim History: Michaela Jefferson presents today for a follow-up visit.  Since her last visit, she continues to do well and recover reasonably from her hip operation.  She is able to ambulate without any difficulties.  She denies any falls or syncope.  She denies any incline in her performance status or activity level.  Her quality of life remain excellent.  She continues to have chronic lower extremity swelling related to her lymph node dissection.  She does not report any headaches, blurry vision, syncope or seizures.  She denies any alteration in mental status or confusion.  She does not report any fevers, chills, sweats.  She does not report any chest pain, palpitation, orthopnea. She does not report any cough, wheezing or hemoptysis. She does not report any nausea, vomiting or abdominal pain.  He denies any weight loss or appetite changes.  She does not report any constipation, diarrhea or change in her bowel habits. She does not report any frequency urgency or hesitancy. She does not report any arthralgias or myalgias.  He does not report any skin rashes or lesions.  She does not report any lymphadenopathy or petechiae.  He denies any bleeding or  clotting tendencies.  Remaining review of systems is negative.   Medications: I have reviewed the patient's current medications.  Current Outpatient Medications  Medication Sig Dispense Refill  . diphenhydrAMINE (BENADRYL) 25 MG tablet Take one tablet every 4 hours for lip swelling.    Marland Kitchen HYDROcodone-acetaminophen (NORCO) 5-325 MG tablet Take 1-2 tablets by mouth every 4 (four) hours as needed for moderate pain or severe pain. 20 tablet 0  . pantoprazole (PROTONIX) 40 MG tablet Take 40 mg by mouth daily.     No current facility-administered medications for this visit.      Allergies:  Allergies  Allergen Reactions  . Sulfa Antibiotics Other (See Comments)    Patient stated,"I haven't had since childhood and I don't know what kind of reaction I have."  . Sulfonamide Derivatives Other (See Comments)    Patient stated,"I haven't had since childhood and I don't know what kind of reaction I have."    Past Medical History, Surgical history, Social history, and Family History were reviewed and updated.   Physical Exam: Blood pressure (!) 143/81, pulse 70, temperature 98.6 F (37 C), temperature source Oral, resp. rate 18, height 5\' 3"  (1.6 m), weight 143 lb 8 oz (65.1 kg), SpO2 100 %.   ECOG: 1 General appearance: Well-appearing woman without distress.. Head: Atraumatic without abnormalities. Oral mucosa: No oral thrush or ulcers. Eyes: Pupils are equal and round reactive to light. Lymph nodes: No lymphadenopathy noted any cervical, supraclavicular, or axillary nodes  Heart:regular rate and rhythm, without any murmurs or gallops.  Right leg edema noted Lung:  Clear to auscultation without any wheezes or dullness to percussion. Abdomin: Soft, nontender, nondistended with good bowel sounds. Musculoskeletal: Improved range of motion in her right hip. Skin: No ecchymosis or petechiae.  Lab Results: Lab Results  Component Value Date   WBC 4.5 07/14/2017   HGB 13.4 07/14/2017   HCT  40.2 07/14/2017   MCV 90.7 07/14/2017   PLT 224 07/14/2017     Chemistry      Component Value Date/Time   NA 140 07/14/2017 0958   NA 141 11/17/2016 0951   K 4.6 07/14/2017 0958   K 4.2 11/17/2016 0951   CL 105 07/14/2017 0958   CO2 27 07/14/2017 0958   CO2 25 11/17/2016 0951   BUN 15 07/14/2017 0958   BUN 13.9 11/17/2016 0951   CREATININE 0.87 07/14/2017 0958   CREATININE 0.8 11/17/2016 0951      Component Value Date/Time   CALCIUM 9.4 07/14/2017 0958   CALCIUM 9.4 11/17/2016 0951   ALKPHOS 103 07/14/2017 0958   ALKPHOS 72 11/17/2016 0951   AST 26 07/14/2017 0958   AST 17 11/17/2016 0951   ALT 21 07/14/2017 0958   ALT 12 11/17/2016 0951   BILITOT 0.3 07/14/2017 0958   BILITOT 0.31 11/17/2016 0951         Impression and Plan:   77 year old woman with:  1.  T2N0 melanoma diagnosed in 2016 of her right heel.  She developed inguinal lymph node involvement or lymph node resection in January 2018.   She remains on active surveillance after declining immunotherapy adjuvantly.  Her laboratory data and physical exam today do not suggest any recurrent disease.  Her labs from 07/14/2017 were personally reviewed and discussed with her today.  I recommended repeat imaging studies in the next 3 to 4 months and institute systemic therapy if she has measurable disease.  The natural course of her disease was reviewed today as well as assess the risk of her relapse.  She does have high risk disease and continued active surveillance is recommended.  She understands that she might require systemic therapy in the future.  2.  Osteoarthritis: She is status post hip replacement with improvement in her mobility and range of motion.  3. Follow-up: Will be in 4 months for repeat physical examination, laboratory data and CT scan.  15  minutes was spent with the patient face-to-face today.  More than 50% of time was dedicated to patient counseling, education and discussing the natural course  of her disease as well as future treatment options.   Zola Button, MD 6/27/201910:23 AM

## 2017-07-16 NOTE — Telephone Encounter (Signed)
Appointments scheduled AVS/Calendar and contrast material provided with instructions per 6/27 los

## 2017-11-03 ENCOUNTER — Ambulatory Visit (HOSPITAL_COMMUNITY)
Admission: RE | Admit: 2017-11-03 | Discharge: 2017-11-03 | Disposition: A | Discharge status: Home / Self Care | Payer: Managed Care, Other (non HMO) | Source: Ambulatory Visit | Attending: Oncology | Admitting: Oncology

## 2017-11-03 ENCOUNTER — Inpatient Hospital Stay: Payer: Managed Care, Other (non HMO) | Attending: Oncology

## 2017-11-03 DIAGNOSIS — R918 Other nonspecific abnormal finding of lung field: Secondary | ICD-10-CM | POA: Insufficient documentation

## 2017-11-03 DIAGNOSIS — C772 Secondary and unspecified malignant neoplasm of intra-abdominal lymph nodes: Secondary | ICD-10-CM | POA: Insufficient documentation

## 2017-11-03 DIAGNOSIS — C439 Malignant melanoma of skin, unspecified: Secondary | ICD-10-CM

## 2017-11-03 DIAGNOSIS — I700 Atherosclerosis of aorta: Secondary | ICD-10-CM | POA: Insufficient documentation

## 2017-11-03 DIAGNOSIS — M1990 Unspecified osteoarthritis, unspecified site: Secondary | ICD-10-CM | POA: Insufficient documentation

## 2017-11-03 DIAGNOSIS — Z79899 Other long term (current) drug therapy: Secondary | ICD-10-CM | POA: Insufficient documentation

## 2017-11-03 LAB — CMP (CANCER CENTER ONLY)
ALK PHOS: 85 U/L (ref 38–126)
ALT: 21 U/L (ref 0–44)
ANION GAP: 9 (ref 5–15)
AST: 23 U/L (ref 15–41)
Albumin: 4 g/dL (ref 3.5–5.0)
BUN: 11 mg/dL (ref 8–23)
CALCIUM: 9.8 mg/dL (ref 8.9–10.3)
CO2: 29 mmol/L (ref 22–32)
CREATININE: 0.87 mg/dL (ref 0.44–1.00)
Chloride: 105 mmol/L (ref 98–111)
Glucose, Bld: 95 mg/dL (ref 70–99)
Potassium: 4.1 mmol/L (ref 3.5–5.1)
Sodium: 143 mmol/L (ref 135–145)
TOTAL PROTEIN: 7.2 g/dL (ref 6.5–8.1)
Total Bilirubin: 0.5 mg/dL (ref 0.3–1.2)

## 2017-11-03 LAB — CBC WITH DIFFERENTIAL (CANCER CENTER ONLY)
ABS IMMATURE GRANULOCYTES: 0.02 10*3/uL (ref 0.00–0.07)
BASOS PCT: 1 %
Basophils Absolute: 0 10*3/uL (ref 0.0–0.1)
EOS PCT: 2 %
Eosinophils Absolute: 0.1 10*3/uL (ref 0.0–0.5)
HCT: 41.7 % (ref 36.0–46.0)
HEMOGLOBIN: 13.8 g/dL (ref 12.0–15.0)
Immature Granulocytes: 0 %
Lymphocytes Relative: 22 %
Lymphs Abs: 1.2 10*3/uL (ref 0.7–4.0)
MCH: 31.2 pg (ref 26.0–34.0)
MCHC: 33.1 g/dL (ref 30.0–36.0)
MCV: 94.3 fL (ref 80.0–100.0)
MONO ABS: 0.4 10*3/uL (ref 0.1–1.0)
MONOS PCT: 7 %
Neutro Abs: 3.8 10*3/uL (ref 1.7–7.7)
Neutrophils Relative %: 68 %
Platelet Count: 235 10*3/uL (ref 150–400)
RBC: 4.42 MIL/uL (ref 3.87–5.11)
RDW: 14.1 % (ref 11.5–15.5)
WBC: 5.6 10*3/uL (ref 4.0–10.5)
nRBC: 0 % (ref 0.0–0.2)

## 2017-11-03 MED ORDER — IOHEXOL 300 MG/ML  SOLN
100.0000 mL | Freq: Once | INTRAMUSCULAR | Status: AC | PRN
  Administered 2017-11-03: 100 mL via INTRAVENOUS

## 2017-11-03 MED ORDER — SODIUM CHLORIDE 0.9 % IJ SOLN
INTRAMUSCULAR | Status: AC
  Filled 2017-11-03: qty 50

## 2017-11-06 ENCOUNTER — Telehealth: Payer: Self-pay

## 2017-11-06 ENCOUNTER — Inpatient Hospital Stay (HOSPITAL_BASED_OUTPATIENT_CLINIC_OR_DEPARTMENT_OTHER): Payer: Managed Care, Other (non HMO) | Admitting: Oncology

## 2017-11-06 VITALS — BP 132/71 | HR 74 | Temp 98.6°F | Resp 17 | Ht 63.0 in | Wt 144.9 lb

## 2017-11-06 DIAGNOSIS — M1990 Unspecified osteoarthritis, unspecified site: Secondary | ICD-10-CM | POA: Diagnosis not present

## 2017-11-06 DIAGNOSIS — Z79899 Other long term (current) drug therapy: Secondary | ICD-10-CM

## 2017-11-06 DIAGNOSIS — C439 Malignant melanoma of skin, unspecified: Secondary | ICD-10-CM | POA: Diagnosis not present

## 2017-11-06 DIAGNOSIS — C772 Secondary and unspecified malignant neoplasm of intra-abdominal lymph nodes: Secondary | ICD-10-CM | POA: Diagnosis not present

## 2017-11-06 DIAGNOSIS — R918 Other nonspecific abnormal finding of lung field: Secondary | ICD-10-CM

## 2017-11-06 DIAGNOSIS — I700 Atherosclerosis of aorta: Secondary | ICD-10-CM

## 2017-11-06 NOTE — Telephone Encounter (Signed)
Printed avs and calender of upcoming appointment. Per 10/18 los. Added referral into proficient and authorized

## 2017-11-06 NOTE — Progress Notes (Signed)
Hematology and Oncology Follow Up Visit  Michaela Jefferson [id number] 1940/03/08 77 y.o. 11/06/2017 9:58 AM System, Pcp Not InNo ref. provider found   Principle Diagnosis: 77 year old woman with T2N0 melanoma of the foot diagnosed in 2016. She developed right inguinal lymph node in January 2018.    Prior Therapy: In July 2016 she underwent excision of a right heel melanoma done by Dr. Marla Roe and the pathology showed malignant melanoma superficial subtype associated with melanoma in situ.  He had T2N0 disease at the time.  She developed a right inguinal mass and underwent a biopsy on 02/13/2016 confirmed the presence of metastatic melanoma.  Current therapy: Active surveillance.   Interim History: Michaela Jefferson returns today for repeat evaluation.  Since last visit, she reports no major changes in her health.  She continues to feel reasonably well with improvement in her quality of life and performance status.  Her appetite remain excellent and her mobility is unchanged.  She has recovered from her hip surgery and resumed work-related duties.  She denies any skin rashes or lesions.  She denies any constitutional symptoms.  She does report right lower extremity swelling which is chronic in nature.  She does not report any headaches, blurry vision, syncope or seizures.  She denies any dizziness or lethargy.  She does not report any fevers, chills, sweats.  She does not report any chest pain, palpitation, orthopnea. She does not report any cough, wheezing or hemoptysis. She does not report any nausea, vomiting or abdominal pain.  He denies any changes in bowel habits. She does not report any frequency urgency or hesitancy. She does not report any joint pain or deformity.  He does not report any skin rashes or lesions.  She does not report any heat or cold intolerance.  He denies any mood changes.  She denies any lymphadenopathy or bruising.  Remaining review of systems is negative.   Medications: I  have reviewed the patient's current medications.  Current Outpatient Medications  Medication Sig Dispense Refill  . diphenhydrAMINE (BENADRYL) 25 MG tablet Take one tablet every 4 hours for lip swelling.    Marland Kitchen HYDROcodone-acetaminophen (NORCO) 5-325 MG tablet Take 1-2 tablets by mouth every 4 (four) hours as needed for moderate pain or severe pain. 20 tablet 0  . pantoprazole (PROTONIX) 40 MG tablet Take 40 mg by mouth daily.     No current facility-administered medications for this visit.      Allergies:  Allergies  Allergen Reactions  . Sulfa Antibiotics Other (See Comments)    Patient stated,"I haven't had since childhood and I don't know what kind of reaction I have."  . Sulfonamide Derivatives Other (See Comments)    Patient stated,"I haven't had since childhood and I don't know what kind of reaction I have."    Past Medical History, Surgical history, Social history, and Family History were reviewed and updated.   Physical Exam:  Blood pressure 132/71, pulse 74, temperature 98.6 F (37 C), temperature source Oral, resp. rate 17, height 5\' 3"  (1.6 m), weight 144 lb 14.4 oz (65.7 kg), SpO2 100 %.   ECOG: 1   General appearance: Comfortable appearing without any discomfort Head: Normocephalic without any trauma Oropharynx: Mucous membranes are moist and pink without any thrush or ulcers. Eyes: Pupils are equal and round reactive to light. Lymph nodes: No cervical, supraclavicular, inguinal or axillary lymphadenopathy.   Heart:regular rate and rhythm.  S1 and S2 with right leg edema. Lung: Clear without any rhonchi or wheezes.  No  dullness to percussion. Abdomin: Soft, nontender, nondistended with good bowel sounds.  No hepatosplenomegaly. Musculoskeletal: No joint deformity or effusion.  Full range of motion noted. Neurological: No deficits noted on motor, sensory and deep tendon reflex exam. Skin: No petechial rash or dryness.  Appeared moist.  Psychiatric: Mood and  affect appeared appropriate.    Lab Results: Lab Results  Component Value Date   WBC 5.6 11/03/2017   HGB 13.8 11/03/2017   HCT 41.7 11/03/2017   MCV 94.3 11/03/2017   PLT 235 11/03/2017     Chemistry      Component Value Date/Time   NA 143 11/03/2017 0901   NA 141 11/17/2016 0951   K 4.1 11/03/2017 0901   K 4.2 11/17/2016 0951   CL 105 11/03/2017 0901   CO2 29 11/03/2017 0901   CO2 25 11/17/2016 0951   BUN 11 11/03/2017 0901   BUN 13.9 11/17/2016 0951   CREATININE 0.87 11/03/2017 0901   CREATININE 0.8 11/17/2016 0951      Component Value Date/Time   CALCIUM 9.8 11/03/2017 0901   CALCIUM 9.4 11/17/2016 0951   ALKPHOS 85 11/03/2017 0901   ALKPHOS 72 11/17/2016 0951   AST 23 11/03/2017 0901   AST 17 11/17/2016 0951   ALT 21 11/03/2017 0901   ALT 12 11/17/2016 0951   BILITOT 0.5 11/03/2017 0901   BILITOT 0.31 11/17/2016 0951      CLINICAL DATA:  History of T2 N0 right foot melanoma diagnosed in 2017 with nodal metastasis in the right groin in January 2018. Current therapy: Active surveillance.  EXAM: CT CHEST, ABDOMEN, AND PELVIS WITH CONTRAST  TECHNIQUE: Multidetector CT imaging of the chest, abdomen and pelvis was performed following the standard protocol during bolus administration of intravenous contrast.  CONTRAST:  116mL OMNIPAQUE IOHEXOL 300 MG/ML  SOLN  COMPARISON:  PET-CT 11/17/2016 and 03/18/2016.  FINDINGS: CT CHEST FINDINGS  Cardiovascular: Atherosclerosis of the aorta, great vessels and coronary arteries. No acute vascular findings on noncontrast imaging. The heart size is normal. There is no pericardial effusion.  Mediastinum/Nodes: There are no enlarged mediastinal, hilar or axillary lymph nodes. There is a stable small hiatal hernia. The trachea and thyroid gland appear unremarkable.  Lungs/Pleura: There is no pleural effusion. 8 mm focal ground-glass opacity in the lingula on image 55/4 is stable. There is mild scarring  medially in the left lower lobe. No new or enlarging pulmonary nodules.  Musculoskeletal/Chest wall: No chest wall mass or suspicious osseous findings.  CT ABDOMEN AND PELVIS FINDINGS  Hepatobiliary: The liver is normal in density without focal abnormality. No significant biliary dilatation post cholecystectomy.  Pancreas: Unremarkable. No pancreatic ductal dilatation or surrounding inflammatory changes.  Spleen: Normal in size without focal abnormality.  Adrenals/Urinary Tract: Both adrenal glands appear normal. There is an 8 mm cyst in the interpolar region of the right kidney (image 60/2). No evidence of renal mass, urinary tract calculus or hydronephrosis. The bladder appears unremarkable.  Stomach/Bowel: No evidence of bowel wall thickening, distention or surrounding inflammatory change. The appendix appears normal.  Vascular/Lymphatic: There are no enlarged abdominal or pelvic lymph nodes. There are stable postsurgical changes in the right inguinal region related to previous lymphadenectomy. There is diffuse aortic and branch vessel atherosclerosis. No acute vascular findings are seen.  Reproductive: Hysterectomy. Stable small left adnexal cyst on image 93/2.  Other: Stable small umbilical hernia containing only fat. No ascites or peritoneal nodularity.  Musculoskeletal: No acute or significant osseous findings. Bilateral total hip arthroplasty and  lower lumbar spine facet disease are noted with a resulting grade 1 anterolisthesis at L4-5.  IMPRESSION: 1. No evidence of metastatic melanoma. 2. Stable postsurgical changes in the right groin. 3. Stable small ground-glass opacity in the left upper lobe over 18 months. Follow-up noncontrast chest CT is recommended every 2 years until 5 years of stability has been established. This recommendation follows the consensus statement: Guidelines for Management of Incidental Pulmonary Nodules Detected on CT  Images: From the Fleischner Society 2017; Radiology 2017; 284:228-243. 4.  Aortic Atherosclerosis (ICD10-I70.0).    Impression and Plan:   77 year old woman with:  1.  Melanoma of the right foot diagnosed in 2016.  She was found to have T2N0 disease initially and developed inguinal lymph node involvement January 2018.  He is status post inguinal lymph node dissection.  She is currently on active surveillance after declining adjuvant therapy.  CT scan on 11/03/2017 was personally reviewed and showed no evidence of metastatic disease.  The natural course of her disease as well as risks and benefits continued active surveillance versus systemic therapy at this time was discussed.  At this time we have elected to proceed with surveillance and repeat imaging studies in 6 months.  Systemic therapy will be needed if she develops recurrent disease.  She is agreeable with this plan.  2.  Osteoarthritis: She continues to improve after her hip replacement surgery.  3.  Dermatology surveillance: I recommended she undergo skin examination.  I will make the appropriate referral for her in the near future.  4. Follow-up: Will be in 6 months for repeat imaging studies.  15  minutes was spent with the patient face-to-face today.  More than 50% of time was dedicated to reviewing the natural course of her disease, reviewing imaging studies and coordinating plan of care.  Zola Button, MD 10/18/20199:58 AM

## 2018-04-26 ENCOUNTER — Telehealth: Payer: Self-pay | Admitting: Oncology

## 2018-04-26 NOTE — Telephone Encounter (Signed)
Called patient re changing 4/17 visit to telephone visit. Per patient she does not want to come during this time (covid-19). Per patient moved lab/fu to May. May 21 for lab and May 28 for f/u. Patient will f/u with central radiology to move ct to May. Message to FS.

## 2018-04-29 ENCOUNTER — Inpatient Hospital Stay: Payer: 59

## 2018-04-29 ENCOUNTER — Ambulatory Visit (HOSPITAL_COMMUNITY): Admission: RE | Admit: 2018-04-29 | Payer: Managed Care, Other (non HMO) | Source: Ambulatory Visit

## 2018-04-30 ENCOUNTER — Other Ambulatory Visit: Payer: Managed Care, Other (non HMO)

## 2018-05-07 ENCOUNTER — Ambulatory Visit: Payer: Managed Care, Other (non HMO) | Admitting: Oncology

## 2018-06-10 ENCOUNTER — Ambulatory Visit (HOSPITAL_COMMUNITY): Payer: 59

## 2018-06-10 ENCOUNTER — Other Ambulatory Visit: Payer: Self-pay

## 2018-06-10 ENCOUNTER — Ambulatory Visit (HOSPITAL_COMMUNITY)
Admission: RE | Admit: 2018-06-10 | Discharge: 2018-06-10 | Disposition: A | Discharge status: Home / Self Care | Payer: 59 | Source: Ambulatory Visit | Attending: Oncology | Admitting: Oncology

## 2018-06-10 ENCOUNTER — Inpatient Hospital Stay: Payer: 59 | Attending: Oncology

## 2018-06-10 DIAGNOSIS — M1990 Unspecified osteoarthritis, unspecified site: Secondary | ICD-10-CM | POA: Diagnosis not present

## 2018-06-10 DIAGNOSIS — Z79899 Other long term (current) drug therapy: Secondary | ICD-10-CM | POA: Diagnosis not present

## 2018-06-10 DIAGNOSIS — C4371 Malignant melanoma of right lower limb, including hip: Secondary | ICD-10-CM | POA: Insufficient documentation

## 2018-06-10 DIAGNOSIS — C439 Malignant melanoma of skin, unspecified: Secondary | ICD-10-CM | POA: Diagnosis not present

## 2018-06-10 DIAGNOSIS — C792 Secondary malignant neoplasm of skin: Secondary | ICD-10-CM | POA: Diagnosis not present

## 2018-06-10 LAB — CMP (CANCER CENTER ONLY)
ALT: 19 U/L (ref 0–44)
AST: 21 U/L (ref 15–41)
Albumin: 4.4 g/dL (ref 3.5–5.0)
Alkaline Phosphatase: 99 U/L (ref 38–126)
Anion gap: 9 (ref 5–15)
BUN: 12 mg/dL (ref 8–23)
CO2: 29 mmol/L (ref 22–32)
Calcium: 10 mg/dL (ref 8.9–10.3)
Chloride: 103 mmol/L (ref 98–111)
Creatinine: 0.88 mg/dL (ref 0.44–1.00)
GFR, Est AFR Am: >60 mL/min (ref >60)
GFR, Estimated: >60 mL/min (ref >60)
Glucose, Bld: 97 mg/dL (ref 70–99)
Potassium: 4.5 mmol/L (ref 3.5–5.1)
Sodium: 141 mmol/L (ref 135–145)
Total Bilirubin: 0.4 mg/dL (ref 0.3–1.2)
Total Protein: 7.6 g/dL (ref 6.5–8.1)

## 2018-06-10 LAB — CBC WITH DIFFERENTIAL (CANCER CENTER ONLY)
Abs Immature Granulocytes: 0.02 10*3/uL (ref 0.00–0.07)
Basophils Absolute: 0 10*3/uL (ref 0.0–0.1)
Basophils Relative: 1 %
Eosinophils Absolute: 0.1 10*3/uL (ref 0.0–0.5)
Eosinophils Relative: 2 %
HCT: 43.9 % (ref 36.0–46.0)
Hemoglobin: 14.5 g/dL (ref 12.0–15.0)
Immature Granulocytes: 0 %
Lymphocytes Relative: 26 %
Lymphs Abs: 1.4 10*3/uL (ref 0.7–4.0)
MCH: 30.7 pg (ref 26.0–34.0)
MCHC: 33 g/dL (ref 30.0–36.0)
MCV: 92.8 fL (ref 80.0–100.0)
Monocytes Absolute: 0.4 10*3/uL (ref 0.1–1.0)
Monocytes Relative: 7 %
Neutro Abs: 3.6 10*3/uL (ref 1.7–7.7)
Neutrophils Relative %: 64 %
Platelet Count: 205 10*3/uL (ref 150–400)
RBC: 4.73 MIL/uL (ref 3.87–5.11)
RDW: 13.5 % (ref 11.5–15.5)
WBC Count: 5.6 10*3/uL (ref 4.0–10.5)
nRBC: 0 % (ref 0.0–0.2)

## 2018-06-10 MED ORDER — SODIUM CHLORIDE (PF) 0.9 % IJ SOLN
INTRAMUSCULAR | Status: AC
  Filled 2018-06-10: qty 50

## 2018-06-10 MED ORDER — IOHEXOL 300 MG/ML  SOLN
100.0000 mL | Freq: Once | INTRAMUSCULAR | Status: AC | PRN
  Administered 2018-06-10: 100 mL via INTRAVENOUS

## 2018-06-17 ENCOUNTER — Other Ambulatory Visit: Payer: Self-pay

## 2018-06-17 ENCOUNTER — Inpatient Hospital Stay: Payer: 59 | Admitting: Oncology

## 2018-06-17 VITALS — BP 156/76 | HR 76 | Temp 98.7°F | Resp 18 | Ht 63.0 in | Wt 143.0 lb

## 2018-06-17 DIAGNOSIS — M1990 Unspecified osteoarthritis, unspecified site: Secondary | ICD-10-CM | POA: Diagnosis not present

## 2018-06-17 DIAGNOSIS — Z79899 Other long term (current) drug therapy: Secondary | ICD-10-CM | POA: Diagnosis not present

## 2018-06-17 DIAGNOSIS — C439 Malignant melanoma of skin, unspecified: Secondary | ICD-10-CM

## 2018-06-17 DIAGNOSIS — C792 Secondary malignant neoplasm of skin: Secondary | ICD-10-CM

## 2018-06-17 DIAGNOSIS — C4371 Malignant melanoma of right lower limb, including hip: Secondary | ICD-10-CM

## 2018-06-17 NOTE — Progress Notes (Signed)
Hematology and Oncology Follow Up Visit  Michaela Jefferson [id number] Apr 24, 1940 78 y.o. 06/17/2018 11:51 AM System, Pcp Not InNo ref. 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  He developed inguinal lymph node involvement 2018.     Prior Therapy: In July 2016 she underwent excision of a right heel melanoma done by Dr. Marla Roe and the pathology showed malignant melanoma superficial subtype associated with melanoma in situ.  He had T2N0 disease at the time.  She developed a right inguinal mass and underwent a biopsy on 02/13/2016 confirmed the presence of metastatic melanoma.  Current therapy: Active surveillance.   Interim History: Michaela Jefferson is here for a return evaluation.  Since the last visit, she reports doing well without any major complaints.  She has returned to work on currently works close to full-time.  She denies any recent hospitalization or illnesses.  She denies any worsening fatigue or lymphadenopathy.  She denies any infections or weakness.   He denied any alteration mental status, neuropathy, confusion or dizziness.  Denies any headaches or lethargy.  Denies any night sweats, weight loss or changes in appetite.  Denied orthopnea, dyspnea on exertion or chest discomfort.  Denies shortness of breath, difficulty breathing hemoptysis or cough.  Denies any abdominal distention, nausea, early satiety or dyspepsia.  Denies any hematuria, frequency, dysuria or nocturia.  Denies any skin irritation, dryness or rash.  Denies any ecchymosis or petechiae.  Denies any lymphadenopathy or clotting.  Denies any heat or cold intolerance.  Denies any anxiety or depression.  Remaining review of system is negative.    Medications: I have reviewed the patient's current medications.  Current Outpatient Medications  Medication Sig Dispense Refill  . diphenhydrAMINE (BENADRYL) 25 MG tablet Take one tablet every 4 hours for  lip swelling.    Marland Kitchen HYDROcodone-acetaminophen (NORCO) 5-325 MG tablet Take 1-2 tablets by mouth every 4 (four) hours as needed for moderate pain or severe pain. 20 tablet 0  . pantoprazole (PROTONIX) 40 MG tablet Take 40 mg by mouth daily.     No current facility-administered medications for this visit.      Allergies:  Allergies  Allergen Reactions  . Sulfa Antibiotics Other (See Comments)    Patient stated,"I haven't had since childhood and I don't know what kind of reaction I have."  . Sulfonamide Derivatives Other (See Comments)    Patient stated,"I haven't had since childhood and I don't know what kind of reaction I have."    Past Medical History, Surgical history, Social history, and Family History were reviewed and updated.   Physical Exam:  Blood pressure (!) 156/76, pulse 76, temperature 98.7 F (37.1 C), temperature source Oral, resp. rate 18, height 5\' 3"  (1.6 m), weight 143 lb (64.9 kg), SpO2 100 %.    ECOG: 1    General appearance: Alert, awake without any distress. Head: Atraumatic without abnormalities Oropharynx: Without any thrush or ulcers. Eyes: No scleral icterus. Lymph nodes: No lymphadenopathy noted in the cervical, supraclavicular, or axillary nodes Heart:regular rate and rhythm, without any murmurs or gallops.   Lung: Clear to auscultation without any rhonchi, wheezes or dullness to percussion. Abdomin: Soft, nontender without any shifting dullness or ascites. Musculoskeletal: No clubbing or cyanosis. Neurological: No motor or sensory deficits. Skin: No rashes or lesions.     Lab Results: Lab Results  Component Value Date   WBC 5.6 06/10/2018   HGB 14.5 06/10/2018   HCT 43.9  06/10/2018   MCV 92.8 06/10/2018   PLT 205 06/10/2018     Chemistry      Component Value Date/Time   NA 141 06/10/2018 1037   NA 141 11/17/2016 0951   K 4.5 06/10/2018 1037   K 4.2 11/17/2016 0951   CL 103 06/10/2018 1037   CO2 29 06/10/2018 1037   CO2 25  11/17/2016 0951   BUN 12 06/10/2018 1037   BUN 13.9 11/17/2016 0951   CREATININE 0.88 06/10/2018 1037   CREATININE 0.8 11/17/2016 0951      Component Value Date/Time   CALCIUM 10.0 06/10/2018 1037   CALCIUM 9.4 11/17/2016 0951   ALKPHOS 99 06/10/2018 1037   ALKPHOS 72 11/17/2016 0951   AST 21 06/10/2018 1037   AST 17 11/17/2016 0951   ALT 19 06/10/2018 1037   ALT 12 11/17/2016 0951   BILITOT 0.4 06/10/2018 1037   BILITOT 0.31 11/17/2016 0951      EXAM: CT CHEST, ABDOMEN, AND PELVIS WITH CONTRAST  TECHNIQUE: Multidetector CT imaging of the chest, abdomen and pelvis was performed following the standard protocol during bolus administration of intravenous contrast.  CONTRAST:  138mL OMNIPAQUE IOHEXOL 300 MG/ML  SOLN  COMPARISON:  11/03/2017  FINDINGS: CT CHEST FINDINGS  Cardiovascular: Heart is normal in size.  No pericardial effusion.  No evidence of thoracic aortic aneurysm. Mild atherosclerotic calcifications of the aortic arch.  Coronary atherosclerosis the LAD and left circumflex.  Mediastinum/Nodes: No suspicious mediastinal, hilar, or axillary lymphadenopathy.  Visualized thyroid is unremarkable.  Lungs/Pleura: Stable 7 mm ground-glass nodule in the left upper lobe (series 4/image 48). Additional 3 mm triangular subpleural nodule in the left upper lobe (series 4/image 45), benign.  Mild scarring/atelectasis in the medial left lower lobe.  No focal consolidation.  No pleural effusion or pneumothorax.  Musculoskeletal: Degenerative changes of the thoracic spine.  CT ABDOMEN PELVIS FINDINGS  Hepatobiliary: Liver is within normal limits. No suspicious/enhancing hepatic lesions.  Status post cholecystectomy. No intrahepatic or extrahepatic ductal dilatation.  Pancreas: Within normal limits.  Spleen: Within normal limits.  Adrenals/Urinary Tract: Adrenal glands are within normal limits.  9 mm lateral right lower pole renal  cyst (series 7/image 13). Left kidney is within normal limits. No hydronephrosis.  Bladder is partially obscured by streak artifact but grossly unremarkable.  Stomach/Bowel: Stomach is notable for a small hiatal hernia.  No evidence of bowel obstruction.  Normal appendix (series 2/image 90).  Mild left colonic stool burden.  Vascular/Lymphatic: No evidence of abdominal aortic aneurysm.  Atherosclerotic calcifications of the abdominal aorta and branch vessels.  No suspicious abdominopelvic lymphadenopathy.  Reproductive: Status post hysterectomy.  Bilateral ovaries are within normal limits.  Other: No abdominopelvic ascites.  Tiny fat containing periumbilical hernia (series 2/image 35).  Postsurgical changes in the right inguinal region.  Musculoskeletal: Mild degenerative changes at L5-S1.  Bilateral hip arthroplasties, without evidence of complication.  IMPRESSION: Postsurgical changes in the right inguinal region.  No evidence of recurrent or metastatic disease.  Stable 7 mm ground-glass nodule in the left upper lobe. Continued follow-up is suggested every 2 years until 5 year stability has been demonstrated.  Additional ancillary findings as above.    Impression and Plan:   78 year old woman with:  1.  Stage III melanoma of the right foot diagnosed in 2016 with inguinal lymph node involvement developed in 2018.  She remains on active surveillance at this time and she has no evidence to suggest recurrent disease.  CT scan obtained on May 21  of 2020 was personally reviewed and showed no evidence of metastatic disease.  The natural course of this disease and risk of relapse was assessed and she will continue to follow with imaging studies every 6 months to complete 5 years.  She understands if he develops relapsed disease additional therapy will be needed including immune based therapy.   2.  Osteoarthritis: Recovered at this time from hip  replacement with full ambulation.  3.  Dermatology surveillance: Continue to recommend dermatology surveillance moving forward.  4. Follow-up: In 6 months for repeat evaluation and CT scan.  15  minutes was spent with the patient face-to-face today.  More than 50% of time was spent on reviewing disease status, imaging studies and discussing treatment options in case of relapse.Zola Button, MD 5/28/202011:51 AM

## 2018-11-19 ENCOUNTER — Telehealth: Payer: Self-pay | Admitting: Oncology

## 2018-11-19 NOTE — Telephone Encounter (Signed)
Rescheduled lab appointment for CT scan per request.

## 2018-12-14 ENCOUNTER — Telehealth: Payer: Self-pay | Admitting: Oncology

## 2018-12-14 ENCOUNTER — Ambulatory Visit (HOSPITAL_COMMUNITY)
Admission: RE | Admit: 2018-12-14 | Discharge: 2018-12-14 | Disposition: A | Discharge status: Home / Self Care | Payer: 59 | Source: Ambulatory Visit | Attending: Oncology | Admitting: Oncology

## 2018-12-14 ENCOUNTER — Encounter (HOSPITAL_COMMUNITY): Payer: Self-pay

## 2018-12-14 ENCOUNTER — Other Ambulatory Visit: Payer: Self-pay

## 2018-12-14 ENCOUNTER — Inpatient Hospital Stay: Payer: 59 | Attending: Oncology

## 2018-12-14 DIAGNOSIS — C439 Malignant melanoma of skin, unspecified: Secondary | ICD-10-CM | POA: Diagnosis not present

## 2018-12-14 LAB — CBC WITH DIFFERENTIAL (CANCER CENTER ONLY)
Abs Immature Granulocytes: 0.01 10*3/uL (ref 0.00–0.07)
Basophils Absolute: 0 10*3/uL (ref 0.0–0.1)
Basophils Relative: 1 %
Eosinophils Absolute: 0.2 10*3/uL (ref 0.0–0.5)
Eosinophils Relative: 3 %
HCT: 43.4 % (ref 36.0–46.0)
Hemoglobin: 14.2 g/dL (ref 12.0–15.0)
Immature Granulocytes: 0 %
Lymphocytes Relative: 33 %
Lymphs Abs: 1.8 10*3/uL (ref 0.7–4.0)
MCH: 31 pg (ref 26.0–34.0)
MCHC: 32.7 g/dL (ref 30.0–36.0)
MCV: 94.8 fL (ref 80.0–100.0)
Monocytes Absolute: 0.4 10*3/uL (ref 0.1–1.0)
Monocytes Relative: 8 %
Neutro Abs: 2.9 10*3/uL (ref 1.7–7.7)
Neutrophils Relative %: 55 %
Platelet Count: 217 10*3/uL (ref 150–400)
RBC: 4.58 MIL/uL (ref 3.87–5.11)
RDW: 13.5 % (ref 11.5–15.5)
WBC Count: 5.3 10*3/uL (ref 4.0–10.5)
nRBC: 0 % (ref 0.0–0.2)

## 2018-12-14 LAB — CMP (CANCER CENTER ONLY)
ALT: 16 U/L (ref 0–44)
AST: 20 U/L (ref 15–41)
Albumin: 4.2 g/dL (ref 3.5–5.0)
Alkaline Phosphatase: 95 U/L (ref 38–126)
Anion gap: 10 (ref 5–15)
BUN: 13 mg/dL (ref 8–23)
CO2: 28 mmol/L (ref 22–32)
Calcium: 9.7 mg/dL (ref 8.9–10.3)
Chloride: 104 mmol/L (ref 98–111)
Creatinine: 0.92 mg/dL (ref 0.44–1.00)
GFR, Est AFR Am: >60 mL/min (ref >60)
GFR, Estimated: >60 mL/min (ref >60)
Glucose, Bld: 97 mg/dL (ref 70–99)
Potassium: 4.2 mmol/L (ref 3.5–5.1)
Sodium: 142 mmol/L (ref 135–145)
Total Bilirubin: 0.5 mg/dL (ref 0.3–1.2)
Total Protein: 7.3 g/dL (ref 6.5–8.1)

## 2018-12-14 MED ORDER — IOHEXOL 300 MG/ML  SOLN
100.0000 mL | Freq: Once | INTRAMUSCULAR | Status: AC | PRN
  Administered 2018-12-14: 100 mL via INTRAVENOUS

## 2018-12-14 MED ORDER — SODIUM CHLORIDE (PF) 0.9 % IJ SOLN
INTRAMUSCULAR | Status: AC
  Filled 2018-12-14: qty 50

## 2018-12-14 NOTE — Telephone Encounter (Signed)
Called patient regarding providers request, patient would prefer this to be a phone visit.

## 2018-12-17 ENCOUNTER — Other Ambulatory Visit: Payer: 59

## 2018-12-21 ENCOUNTER — Inpatient Hospital Stay: Payer: 59 | Attending: Oncology | Admitting: Oncology

## 2018-12-21 DIAGNOSIS — C439 Malignant melanoma of skin, unspecified: Secondary | ICD-10-CM | POA: Diagnosis not present

## 2018-12-21 NOTE — Progress Notes (Signed)
Hematology and Oncology Follow Up for Telemedicine Visits  Michaela Jefferson [id number] 06/09/1940 78 y.o. 12/21/2018 9:38 AM System, Pcp Not InNo ref. provider found   I connected with Michaela Jefferson on 12/21/18 at 10:00 AM EST by telephone visit and verified that I am speaking with the correct person using two identifiers.   I discussed the limitations, risks, security and privacy concerns of performing an evaluation and management service by telemedicine and the availability of in-person appointments. I also discussed with the patient that there may be a patient responsible charge related to this service. The patient expressed understanding and agreed to proceed.  Other persons participating in the visit and their role in the encounter:  None  Patient's location:  Home Provider's location:  office   Principle Diagnosis: 78 year old woman with melanoma of the right foot with inguinal lymph node involvement diagnosed in 2016.  She was found to have stage III disease at this time.     Prior Therapy: In July 2016 she underwent excision of a right heel melanoma done by Dr. Marla Roe and the pathology showed malignant melanoma superficial subtype associated with melanoma in situ.  He had T2N0 disease at the time.  She developed a right inguinal mass and underwent a biopsy on 02/13/2016 confirmed the presence of metastatic melanoma.  Current therapy: Active surveillance.    Interim History: Michaela Jefferson reports of feeling reasonably well without any recent complaints at this time.  She continues to work her up close to 8 hours a day and the end of her working hours she does report lower extremity swelling related to her previous surgery.  She denies any pain or discomfort.  Her performance status and quality of life remains intact.    Medications: I have reviewed the patient's current medications without any changes. Current Outpatient Medications  Medication Sig Dispense Refill  .  diphenhydrAMINE (BENADRYL) 25 MG tablet Take one tablet every 4 hours for lip swelling.    Marland Kitchen HYDROcodone-acetaminophen (NORCO) 5-325 MG tablet Take 1-2 tablets by mouth every 4 (four) hours as needed for moderate pain or severe pain. 20 tablet 0  . pantoprazole (PROTONIX) 40 MG tablet Take 40 mg by mouth daily.     No current facility-administered medications for this visit.      Allergies:  Allergies  Allergen Reactions  . Sulfa Antibiotics Other (See Comments)    Patient stated,"I haven't had since childhood and I don't know what kind of reaction I have."  . Sulfonamide Derivatives Other (See Comments)    Patient stated,"I haven't had since childhood and I don't know what kind of reaction I have."    Past Medical History, Surgical history, Social history, and Family History reviewed without any changes.   Lab Results: Lab Results  Component Value Date   WBC 5.3 12/14/2018   HGB 14.2 12/14/2018   HCT 43.4 12/14/2018   MCV 94.8 12/14/2018   PLT 217 12/14/2018     Chemistry      Component Value Date/Time   NA 142 12/14/2018 1153   NA 141 11/17/2016 0951   K 4.2 12/14/2018 1153   K 4.2 11/17/2016 0951   CL 104 12/14/2018 1153   CO2 28 12/14/2018 1153   CO2 25 11/17/2016 0951   BUN 13 12/14/2018 1153   BUN 13.9 11/17/2016 0951   CREATININE 0.92 12/14/2018 1153   CREATININE 0.8 11/17/2016 0951      Component Value Date/Time   CALCIUM 9.7 12/14/2018 1153   CALCIUM 9.4 11/17/2016  0951   ALKPHOS 95 12/14/2018 1153   ALKPHOS 72 11/17/2016 0951   AST 20 12/14/2018 1153   AST 17 11/17/2016 0951   ALT 16 12/14/2018 1153   ALT 12 11/17/2016 0951   BILITOT 0.5 12/14/2018 1153   BILITOT 0.31 11/17/2016 0951       Radiological Studies: IMPRESSION: 1. No evidence of metastatic disease postoperative changes in the right groin as before. 2. Stable 7 mm ground-glass attenuation in the left upper lobe. Recommend attention on follow-up to document a total of 5  years stability 3. Atherosclerotic changes in the thoracic and abdominal aorta.  Aortic Atherosclerosis (ICD10-I70.0).   Impression and Plan:  78 year old woman with:  1.    Melanoma of the right foot diagnosed in 2016 and subsequently developed inguinal involvement in 2018.  She status post surgical resection currently on active surveillance.     CT scan obtained on 12/14/2018 was personally reviewed and discussed with the patient.  She does not have any evidence to suggest disease at this time.  The natural course of her disease and risk of relapse was assessed as well as treatment options if needed if she develops systemic disease.  At this time I recommended continued active surveillance and repeat imaging studies in 6 months.  She is agreeable with this plan.    2. Follow-up:  She will return in 6 months for repeat follow-up.    I discussed the assessment and treatment plan with the patient. The patient was provided an opportunity to ask questions and all were answered. The patient agreed with the plan and demonstrated an understanding of the instructions.   The patient was advised to call back or seek an in-person evaluation if the symptoms worsen or if the condition fails to improve as anticipated.  I provided 20 minutes of non face-to-face telephone visit time during this encounter, and > 50% was dedicated to reviewing her disease status, laboratory data as well as answering questions regarding future plan of care.  Zola Button, MD 12/21/2018 9:38 AM

## 2018-12-22 ENCOUNTER — Telehealth: Payer: Self-pay | Admitting: Oncology

## 2018-12-22 NOTE — Telephone Encounter (Signed)
Scheduled appt per 1/21 los.  Spoke with pt and she is aware of the appt date and time. 

## 2019-06-21 ENCOUNTER — Ambulatory Visit (HOSPITAL_COMMUNITY)
Admission: RE | Admit: 2019-06-21 | Discharge: 2019-06-21 | Disposition: A | Discharge status: Home / Self Care | Payer: 59 | Source: Ambulatory Visit | Attending: Oncology | Admitting: Oncology

## 2019-06-21 ENCOUNTER — Inpatient Hospital Stay: Payer: 59 | Attending: Oncology

## 2019-06-21 ENCOUNTER — Other Ambulatory Visit: Payer: 59

## 2019-06-21 ENCOUNTER — Other Ambulatory Visit: Payer: Self-pay

## 2019-06-21 DIAGNOSIS — C439 Malignant melanoma of skin, unspecified: Secondary | ICD-10-CM | POA: Diagnosis present

## 2019-06-21 DIAGNOSIS — Z85820 Personal history of malignant melanoma of skin: Secondary | ICD-10-CM | POA: Insufficient documentation

## 2019-06-21 DIAGNOSIS — R918 Other nonspecific abnormal finding of lung field: Secondary | ICD-10-CM | POA: Insufficient documentation

## 2019-06-21 DIAGNOSIS — I700 Atherosclerosis of aorta: Secondary | ICD-10-CM | POA: Insufficient documentation

## 2019-06-21 DIAGNOSIS — R609 Edema, unspecified: Secondary | ICD-10-CM | POA: Insufficient documentation

## 2019-06-21 LAB — CMP (CANCER CENTER ONLY)
ALT: 16 U/L (ref 0–44)
AST: 17 U/L (ref 15–41)
Albumin: 4 g/dL (ref 3.5–5.0)
Alkaline Phosphatase: 81 U/L (ref 38–126)
Anion gap: 11 (ref 5–15)
BUN: 10 mg/dL (ref 8–23)
CO2: 27 mmol/L (ref 22–32)
Calcium: 9.6 mg/dL (ref 8.9–10.3)
Chloride: 105 mmol/L (ref 98–111)
Creatinine: 0.84 mg/dL (ref 0.44–1.00)
GFR, Est AFR Am: >60 mL/min (ref >60)
GFR, Estimated: >60 mL/min (ref >60)
Glucose, Bld: 109 mg/dL — ABNORMAL HIGH (ref 70–99)
Potassium: 3.9 mmol/L (ref 3.5–5.1)
Sodium: 143 mmol/L (ref 135–145)
Total Bilirubin: 0.4 mg/dL (ref 0.3–1.2)
Total Protein: 6.8 g/dL (ref 6.5–8.1)

## 2019-06-21 LAB — CBC WITH DIFFERENTIAL (CANCER CENTER ONLY)
Abs Immature Granulocytes: 0.02 10*3/uL (ref 0.00–0.07)
Basophils Absolute: 0 10*3/uL (ref 0.0–0.1)
Basophils Relative: 1 %
Eosinophils Absolute: 0.1 10*3/uL (ref 0.0–0.5)
Eosinophils Relative: 2 %
HCT: 42.9 % (ref 36.0–46.0)
Hemoglobin: 14.1 g/dL (ref 12.0–15.0)
Immature Granulocytes: 0 %
Lymphocytes Relative: 26 %
Lymphs Abs: 1.7 10*3/uL (ref 0.7–4.0)
MCH: 30 pg (ref 26.0–34.0)
MCHC: 32.9 g/dL (ref 30.0–36.0)
MCV: 91.3 fL (ref 80.0–100.0)
Monocytes Absolute: 0.4 10*3/uL (ref 0.1–1.0)
Monocytes Relative: 6 %
Neutro Abs: 4.1 10*3/uL (ref 1.7–7.7)
Neutrophils Relative %: 65 %
Platelet Count: 210 10*3/uL (ref 150–400)
RBC: 4.7 MIL/uL (ref 3.87–5.11)
RDW: 14.2 % (ref 11.5–15.5)
WBC Count: 6.3 10*3/uL (ref 4.0–10.5)
nRBC: 0 % (ref 0.0–0.2)

## 2019-06-21 MED ORDER — IOHEXOL 300 MG/ML  SOLN
100.0000 mL | Freq: Once | INTRAMUSCULAR | Status: AC | PRN
  Administered 2019-06-21: 100 mL via INTRAVENOUS

## 2019-06-21 MED ORDER — SODIUM CHLORIDE (PF) 0.9 % IJ SOLN
INTRAMUSCULAR | Status: AC
  Filled 2019-06-21: qty 50

## 2019-06-28 ENCOUNTER — Inpatient Hospital Stay: Payer: 59 | Admitting: Oncology

## 2019-06-28 ENCOUNTER — Other Ambulatory Visit: Payer: Self-pay

## 2019-06-28 VITALS — BP 142/68 | HR 62 | Temp 98.1°F | Resp 18 | Ht 63.0 in | Wt 136.9 lb

## 2019-06-28 DIAGNOSIS — C439 Malignant melanoma of skin, unspecified: Secondary | ICD-10-CM | POA: Diagnosis not present

## 2019-06-28 DIAGNOSIS — R918 Other nonspecific abnormal finding of lung field: Secondary | ICD-10-CM | POA: Diagnosis not present

## 2019-06-28 DIAGNOSIS — Z85820 Personal history of malignant melanoma of skin: Secondary | ICD-10-CM | POA: Diagnosis present

## 2019-06-28 DIAGNOSIS — R609 Edema, unspecified: Secondary | ICD-10-CM | POA: Diagnosis not present

## 2019-06-28 DIAGNOSIS — I700 Atherosclerosis of aorta: Secondary | ICD-10-CM | POA: Diagnosis not present

## 2019-06-28 NOTE — Progress Notes (Signed)
Hematology and Oncology Follow Up Visit  Michaela Jefferson [id number] December 14, 1940 79 y.o. 06/28/2019 10:13 AM Michaela Jefferson, MDNo ref. 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     Prior Therapy: In July 2016 she underwent excision of a right heel melanoma done by Dr. Marla Jefferson and the pathology showed malignant melanoma superficial subtype associated with melanoma in situ.  He had T2N0 disease at the time.  She developed a right inguinal mass and underwent a biopsy on 02/13/2016 confirmed the presence of metastatic melanoma.  Current therapy: Active surveillance.    Interim History: Michaela Jefferson is here for return evaluation.  Since the last visit, he reports no major changes in her health.  She continues to be an excellent shape and health.  She denies any recent hospitalization or illnesses.  She continues to be active without any shortness of breath or difficulty breathing.  She does report chronic lower extremity edema on the right related to her lymph node surgery.    Medications: Updated without changes. Current Outpatient Medications  Medication Sig Dispense Refill  . diphenhydrAMINE (BENADRYL) 25 MG tablet Take one tablet every 4 hours for lip swelling.    Marland Kitchen HYDROcodone-acetaminophen (NORCO) 5-325 MG tablet Take 1-2 tablets by mouth every 4 (four) hours as needed for moderate pain or severe pain. 20 tablet 0  . pantoprazole (PROTONIX) 40 MG tablet Take 40 mg by mouth daily.     No current facility-administered medications for this visit.     Allergies:  Allergies  Allergen Reactions  . Sulfa Antibiotics Other (See Comments)    Patient stated,"I haven't had since childhood and I don't know what kind of reaction I have."  . Sulfonamide Derivatives Other (See Comments)    Patient stated,"I haven't had since childhood and I don't know what kind of reaction I  have."     Physical exam: Blood pressure (!) 142/68, pulse 62, temperature 98.1 F (36.7 C), temperature source Temporal, resp. rate 18, height 5\' 3"  (1.6 m), weight 136 lb 14.4 oz (62.1 kg), SpO2 100 %.   ECOG 1  General appearance: Comfortable appearing without any discomfort Head: Normocephalic without any trauma Oropharynx: Mucous membranes are moist and pink without any thrush or ulcers. Eyes: Pupils are equal and round reactive to light. Lymph nodes: No cervical, supraclavicular, inguinal or axillary lymphadenopathy.   Heart:regular rate and rhythm.  S1 and S2.  Trace edema right more than left. Lung: Clear without any rhonchi or wheezes.  No dullness to percussion. Abdomin: Soft, nontender, nondistended with good bowel sounds.  No hepatosplenomegaly. Musculoskeletal: No joint deformity or effusion.  Full range of motion noted. Neurological: No deficits noted on motor, sensory and deep tendon reflex exam. Skin: No petechial rash or dryness.  Appeared moist.      Lab Results: Lab Results  Component Value Date   WBC 6.3 06/21/2019   HGB 14.1 06/21/2019   HCT 42.9 06/21/2019   MCV 91.3 06/21/2019   PLT 210 06/21/2019     Chemistry      Component Value Date/Time   NA 143 06/21/2019 1105   NA 141 11/17/2016 0951   K 3.9 06/21/2019 1105   K 4.2 11/17/2016 0951   CL 105 06/21/2019 1105   CO2 27 06/21/2019 1105   CO2 25 11/17/2016 0951   BUN 10 06/21/2019 1105   BUN 13.9 11/17/2016 0951   CREATININE  0.84 06/21/2019 1105   CREATININE 0.8 11/17/2016 0951      Component Value Date/Time   CALCIUM 9.6 06/21/2019 1105   CALCIUM 9.4 11/17/2016 0951   ALKPHOS 81 06/21/2019 1105   ALKPHOS 72 11/17/2016 0951   AST 17 06/21/2019 1105   AST 17 11/17/2016 0951   ALT 16 06/21/2019 1105   ALT 12 11/17/2016 0951   BILITOT 0.4 06/21/2019 1105   BILITOT 0.31 11/17/2016 0951       IMPRESSION: 1. Stable exam. No new or progressive findings in the chest, abdomen, or pelvis  to suggest metastatic disease. 2. 6 mm ground-glass opacity in the left upper lobe. Attention on follow-up imaging recommended. 3. Aortic Atherosclerosis (ICD10-I70.0).    Impression and Plan:  79 year old woman with:  1.    Stage III melanoma of the right foot with inguinal lymph node involvement diagnosed in 2016.      She has been on active surveillance after declining adjuvant therapy in 2018.  CT scan obtained on June 21, 2019 was personally reviewed and discussed with the patient today and showed no evidence of metastatic disease.  Laboratory data also reviewed and discussed today continues to show no abnormalities.    Risks and benefits of continuing this approach were reviewed and I recommended continued surveillance for the time being and consider systemic therapy if she developed measurable disease.  Plan is to repeat imaging studies in 2022 and 2023 and discontinue after that.  After discussion she is agreeable to continue with this plan.    2. Follow-up:  In 8 months for repeat evaluation and imaging studies.   30  minutes were dedicated to this visit. The time was spent on reviewing laboratory data, imaging studies, discussing treatment options,  and answering questions regarding future plan.   Michaela Button, MD 06/28/2019 10:13 AM

## 2019-06-29 ENCOUNTER — Telehealth: Payer: Self-pay | Admitting: Oncology

## 2019-06-29 NOTE — Telephone Encounter (Signed)
Scheduled appt per 6/8 los,  Printed and mailed appt calendar

## 2019-08-03 ENCOUNTER — Telehealth: Payer: Self-pay | Admitting: Oncology

## 2019-08-03 NOTE — Telephone Encounter (Signed)
UIVHOYW:[id number] Faxed medical records to [facility] @ fax [phone]

## 2020-03-26 ENCOUNTER — Telehealth: Payer: Self-pay | Admitting: Oncology

## 2020-03-26 NOTE — Telephone Encounter (Signed)
Scheduled appt per 3/4 sch msg - left message for patient with appt date and time

## 2020-03-27 ENCOUNTER — Inpatient Hospital Stay: Payer: 59

## 2020-03-27 ENCOUNTER — Telehealth: Payer: Self-pay | Admitting: Oncology

## 2020-03-27 NOTE — Telephone Encounter (Signed)
Left message that appointment time/date requested for 9am-12pm 3/29 was not available. Left message to call back to reschedule appointment to a different date/time if needed.

## 2020-03-30 ENCOUNTER — Telehealth: Payer: Self-pay | Admitting: Oncology

## 2020-03-30 NOTE — Telephone Encounter (Signed)
R/s appointment per patient's request. Pt aware of updated appointment date and time.

## 2020-04-03 ENCOUNTER — Other Ambulatory Visit: Payer: Self-pay

## 2020-04-03 ENCOUNTER — Ambulatory Visit (HOSPITAL_COMMUNITY)
Admission: RE | Admit: 2020-04-03 | Discharge: 2020-04-03 | Disposition: A | Discharge status: Home / Self Care | Payer: 59 | Source: Ambulatory Visit | Attending: Oncology | Admitting: Oncology

## 2020-04-03 ENCOUNTER — Inpatient Hospital Stay: Payer: 59 | Attending: Oncology

## 2020-04-03 ENCOUNTER — Inpatient Hospital Stay: Payer: 59 | Admitting: Oncology

## 2020-04-03 DIAGNOSIS — C439 Malignant melanoma of skin, unspecified: Secondary | ICD-10-CM

## 2020-04-03 DIAGNOSIS — Z85820 Personal history of malignant melanoma of skin: Secondary | ICD-10-CM | POA: Diagnosis present

## 2020-04-03 LAB — CMP (CANCER CENTER ONLY)
ALT: 15 U/L (ref 0–44)
AST: 18 U/L (ref 15–41)
Albumin: 3.9 g/dL (ref 3.5–5.0)
Alkaline Phosphatase: 97 U/L (ref 38–126)
Anion gap: 8 (ref 5–15)
BUN: 11 mg/dL (ref 8–23)
CO2: 28 mmol/L (ref 22–32)
Calcium: 9.3 mg/dL (ref 8.9–10.3)
Chloride: 107 mmol/L (ref 98–111)
Creatinine: 0.85 mg/dL (ref 0.44–1.00)
GFR, Estimated: >60 mL/min (ref >60)
Glucose, Bld: 96 mg/dL (ref 70–99)
Potassium: 4.4 mmol/L (ref 3.5–5.1)
Sodium: 143 mmol/L (ref 135–145)
Total Bilirubin: 0.3 mg/dL (ref 0.3–1.2)
Total Protein: 6.8 g/dL (ref 6.5–8.1)

## 2020-04-03 LAB — CBC WITH DIFFERENTIAL (CANCER CENTER ONLY)
Abs Immature Granulocytes: 0 10*3/uL (ref 0.00–0.07)
Basophils Absolute: 0 10*3/uL (ref 0.0–0.1)
Basophils Relative: 1 %
Eosinophils Absolute: 0.2 10*3/uL (ref 0.0–0.5)
Eosinophils Relative: 4 %
HCT: 41.4 % (ref 36.0–46.0)
Hemoglobin: 13.4 g/dL (ref 12.0–15.0)
Immature Granulocytes: 0 %
Lymphocytes Relative: 30 %
Lymphs Abs: 1.3 10*3/uL (ref 0.7–4.0)
MCH: 29.9 pg (ref 26.0–34.0)
MCHC: 32.4 g/dL (ref 30.0–36.0)
MCV: 92.4 fL (ref 80.0–100.0)
Monocytes Absolute: 0.4 10*3/uL (ref 0.1–1.0)
Monocytes Relative: 8 %
Neutro Abs: 2.5 10*3/uL (ref 1.7–7.7)
Neutrophils Relative %: 57 %
Platelet Count: 207 10*3/uL (ref 150–400)
RBC: 4.48 MIL/uL (ref 3.87–5.11)
RDW: 14.4 % (ref 11.5–15.5)
WBC Count: 4.3 10*3/uL (ref 4.0–10.5)
nRBC: 0 % (ref 0.0–0.2)

## 2020-04-10 ENCOUNTER — Other Ambulatory Visit (HOSPITAL_COMMUNITY): Payer: 59

## 2020-04-11 ENCOUNTER — Ambulatory Visit: Payer: 59 | Admitting: Oncology

## 2020-04-24 ENCOUNTER — Ambulatory Visit (HOSPITAL_COMMUNITY)
Admission: RE | Admit: 2020-04-24 | Discharge: 2020-04-24 | Disposition: A | Discharge status: Home / Self Care | Payer: 59 | Source: Ambulatory Visit | Attending: Oncology | Admitting: Oncology

## 2020-04-24 ENCOUNTER — Encounter (HOSPITAL_COMMUNITY): Payer: Self-pay

## 2020-04-24 ENCOUNTER — Other Ambulatory Visit: Payer: Self-pay

## 2020-04-24 DIAGNOSIS — C439 Malignant melanoma of skin, unspecified: Secondary | ICD-10-CM | POA: Insufficient documentation

## 2020-04-24 MED ORDER — IOHEXOL 300 MG/ML  SOLN
100.0000 mL | Freq: Once | INTRAMUSCULAR | Status: AC | PRN
  Administered 2020-04-24: 100 mL via INTRAVENOUS

## 2020-05-08 ENCOUNTER — Inpatient Hospital Stay: Payer: 59 | Attending: Oncology | Admitting: Oncology

## 2020-05-08 ENCOUNTER — Other Ambulatory Visit: Payer: Self-pay

## 2020-05-08 VITALS — BP 146/84 | HR 78 | Temp 97.8°F | Resp 18 | Ht 63.0 in | Wt 137.3 lb

## 2020-05-08 DIAGNOSIS — Z85820 Personal history of malignant melanoma of skin: Secondary | ICD-10-CM | POA: Insufficient documentation

## 2020-05-08 DIAGNOSIS — I700 Atherosclerosis of aorta: Secondary | ICD-10-CM | POA: Insufficient documentation

## 2020-05-08 DIAGNOSIS — C439 Malignant melanoma of skin, unspecified: Secondary | ICD-10-CM

## 2020-05-08 NOTE — Progress Notes (Signed)
Hematology and Oncology Follow Up Visit  Michaela Jefferson [id number] April 13, 1940 80 y.o. 05/08/2020 2:55 PM Michaela Jefferson, MDSmith, Michaela Jenny, MD      Principle Diagnosis: 80 year old woman with melanoma of the right foot diagnosed in 2016.  She presented with stage III disease with inguinal lymph node involvement.     Prior Therapy: In July 2016 she underwent excision of a right heel melanoma done by Dr. Marla Roe and the pathology showed malignant melanoma superficial subtype associated with melanoma in situ.  He had T2N0 disease at the time.  She developed a right inguinal mass and underwent a biopsy on 02/13/2016 confirmed the presence of metastatic melanoma.  Current therapy: Active surveillance.    Interim History: Michaela Jefferson returns today for repeat follow-up.  Since her last visit, she reports no major changes in her health.  She continues to be active and attends to activities of daily living.  She denies any recent hospitalizations or illnesses.  She denies any neurological deficits or changes in her bowel habits.  She continues to work and has not had any difficulties maintaining adequate stamina to do so.    Medications: Reviewed without changes. Current Outpatient Medications  Medication Sig Dispense Refill  . diphenhydrAMINE (BENADRYL) 25 MG tablet Take one tablet every 4 hours for lip swelling.    Marland Kitchen HYDROcodone-acetaminophen (NORCO) 5-325 MG tablet Take 1-2 tablets by mouth every 4 (four) hours as needed for moderate pain or severe pain. 20 tablet 0  . pantoprazole (PROTONIX) 40 MG tablet Take 40 mg by mouth daily.     No current facility-administered medications for this visit.     Allergies:  Allergies  Allergen Reactions  . Sulfa Antibiotics Other (See Comments)    Patient stated,"I haven't had since childhood and I don't know what kind of reaction I have."  . Sulfonamide Derivatives Other (See Comments)    Patient stated,"I haven't had since childhood and I  don't know what kind of reaction I have."     Physical exam:  Blood pressure (!) 146/84, pulse 78, temperature 97.8 F (36.6 C), temperature source Tympanic, resp. rate 18, height 5\' 3"  (1.6 m), weight 137 lb 4.8 oz (62.3 kg), SpO2 99 %.   ECOG 1     General appearance: Alert, awake without any distress. Head: Atraumatic without abnormalities Oropharynx: Without any thrush or ulcers. Eyes: No scleral icterus. Lymph nodes: No lymphadenopathy noted in the cervical, supraclavicular, or axillary nodes Heart:regular rate and rhythm, without any murmurs or gallops.   Lung: Clear to auscultation without any rhonchi, wheezes or dullness to percussion. Abdomin: Soft, nontender without any shifting dullness or ascites. Musculoskeletal: No clubbing or cyanosis. Neurological: No motor or sensory deficits. Skin: No rashes or lesions.       Lab Results: Lab Results  Component Value Date   WBC 4.3 04/03/2020   HGB 13.4 04/03/2020   HCT 41.4 04/03/2020   MCV 92.4 04/03/2020   PLT 207 04/03/2020     Chemistry      Component Value Date/Time   NA 143 04/03/2020 0803   NA 141 11/17/2016 0951   K 4.4 04/03/2020 0803   K 4.2 11/17/2016 0951   CL 107 04/03/2020 0803   CO2 28 04/03/2020 0803   CO2 25 11/17/2016 0951   BUN 11 04/03/2020 0803   BUN 13.9 11/17/2016 0951   CREATININE 0.85 04/03/2020 0803   CREATININE 0.8 11/17/2016 0951      Component Value Date/Time   CALCIUM 9.3 04/03/2020 0803  CALCIUM 9.4 11/17/2016 0951   ALKPHOS 97 04/03/2020 0803   ALKPHOS 72 11/17/2016 0951   AST 18 04/03/2020 0803   AST 17 11/17/2016 0951   ALT 15 04/03/2020 0803   ALT 12 11/17/2016 0951   BILITOT 0.3 04/03/2020 0803   BILITOT 0.31 11/17/2016 0951         IMPRESSION: 1. No evidence of metastatic disease within the chest, abdomen, or pelvis. 2. Stable 7 mm subtle ground-glass attenuation left upper lobe, continued attention on follow-up imaging is recommended. 3. Aortic  atherosclerosis.  Aortic Atherosclerosis (ICD10-I70.0).   Impression and Plan:  80 year old woman with:  1.    Melanoma of the right foot diagnosed in 2016.  She presented with stage III disease and inguinal lymph nodes.   The natural course of this disease was reviewed at this time and future treatment options were reiterated.  CT scan obtained on April 24, 2020 was discussed and showed no evidence of metastatic disease.  I recommended continued active surveillance at this time and institute immunotherapy only if she has relapsed disease.  The plan is to repeat imaging studies in 1 year and suspend imaging studies after that.  She is agreeable to proceed at this time.  2. Follow-up:  In 12 months for repeat follow-up.   30  minutes were spent on this encounter.  The time was dedicated to reviewing laboratory data, imaging studies and outlining future plan of care.   Zola Button, MD 05/08/2020 2:55 PM

## 2021-05-03 ENCOUNTER — Telehealth: Payer: Self-pay | Admitting: Oncology

## 2021-05-03 NOTE — Telephone Encounter (Signed)
Called patient regarding upcoming appointment, left a voicemail. 

## 2021-05-07 ENCOUNTER — Other Ambulatory Visit: Payer: Self-pay

## 2021-05-07 ENCOUNTER — Inpatient Hospital Stay: Payer: 59 | Attending: Oncology

## 2021-05-07 ENCOUNTER — Encounter (HOSPITAL_COMMUNITY): Payer: Self-pay

## 2021-05-07 ENCOUNTER — Ambulatory Visit (HOSPITAL_COMMUNITY)
Admission: RE | Admit: 2021-05-07 | Discharge: 2021-05-07 | Disposition: A | Discharge status: Home / Self Care | Payer: 59 | Source: Ambulatory Visit | Attending: Oncology | Admitting: Oncology

## 2021-05-07 DIAGNOSIS — C775 Secondary and unspecified malignant neoplasm of intrapelvic lymph nodes: Secondary | ICD-10-CM | POA: Insufficient documentation

## 2021-05-07 DIAGNOSIS — K449 Diaphragmatic hernia without obstruction or gangrene: Secondary | ICD-10-CM | POA: Insufficient documentation

## 2021-05-07 DIAGNOSIS — C439 Malignant melanoma of skin, unspecified: Secondary | ICD-10-CM | POA: Insufficient documentation

## 2021-05-07 DIAGNOSIS — I700 Atherosclerosis of aorta: Secondary | ICD-10-CM | POA: Insufficient documentation

## 2021-05-07 DIAGNOSIS — C4371 Malignant melanoma of right lower limb, including hip: Secondary | ICD-10-CM | POA: Insufficient documentation

## 2021-05-07 DIAGNOSIS — Z79899 Other long term (current) drug therapy: Secondary | ICD-10-CM | POA: Insufficient documentation

## 2021-05-07 DIAGNOSIS — M7989 Other specified soft tissue disorders: Secondary | ICD-10-CM | POA: Insufficient documentation

## 2021-05-07 DIAGNOSIS — R911 Solitary pulmonary nodule: Secondary | ICD-10-CM | POA: Insufficient documentation

## 2021-05-07 LAB — CBC WITH DIFFERENTIAL (CANCER CENTER ONLY)
Abs Immature Granulocytes: 0.01 10*3/uL (ref 0.00–0.07)
Basophils Absolute: 0 10*3/uL (ref 0.0–0.1)
Basophils Relative: 1 %
Eosinophils Absolute: 0.1 10*3/uL (ref 0.0–0.5)
Eosinophils Relative: 2 %
HCT: 41.5 % (ref 36.0–46.0)
Hemoglobin: 13.6 g/dL (ref 12.0–15.0)
Immature Granulocytes: 0 %
Lymphocytes Relative: 25 %
Lymphs Abs: 1.5 10*3/uL (ref 0.7–4.0)
MCH: 30.9 pg (ref 26.0–34.0)
MCHC: 32.8 g/dL (ref 30.0–36.0)
MCV: 94.3 fL (ref 80.0–100.0)
Monocytes Absolute: 0.4 10*3/uL (ref 0.1–1.0)
Monocytes Relative: 6 %
Neutro Abs: 3.9 10*3/uL (ref 1.7–7.7)
Neutrophils Relative %: 66 %
Platelet Count: 217 10*3/uL (ref 150–400)
RBC: 4.4 MIL/uL (ref 3.87–5.11)
RDW: 14.1 % (ref 11.5–15.5)
WBC Count: 5.9 10*3/uL (ref 4.0–10.5)
nRBC: 0 % (ref 0.0–0.2)

## 2021-05-07 LAB — CMP (CANCER CENTER ONLY)
ALT: 17 U/L (ref 0–44)
AST: 20 U/L (ref 15–41)
Albumin: 4.4 g/dL (ref 3.5–5.0)
Alkaline Phosphatase: 76 U/L (ref 38–126)
Anion gap: 7 (ref 5–15)
BUN: 14 mg/dL (ref 8–23)
CO2: 30 mmol/L (ref 22–32)
Calcium: 9.5 mg/dL (ref 8.9–10.3)
Chloride: 104 mmol/L (ref 98–111)
Creatinine: 0.86 mg/dL (ref 0.44–1.00)
GFR, Estimated: >60 mL/min (ref >60)
Glucose, Bld: 97 mg/dL (ref 70–99)
Potassium: 4.1 mmol/L (ref 3.5–5.1)
Sodium: 141 mmol/L (ref 135–145)
Total Bilirubin: 0.5 mg/dL (ref 0.3–1.2)
Total Protein: 7.2 g/dL (ref 6.5–8.1)

## 2021-05-07 MED ORDER — IOHEXOL 300 MG/ML  SOLN
100.0000 mL | Freq: Once | INTRAMUSCULAR | Status: AC | PRN
  Administered 2021-05-07: 100 mL via INTRAVENOUS

## 2021-05-07 MED ORDER — SODIUM CHLORIDE (PF) 0.9 % IJ SOLN
INTRAMUSCULAR | Status: AC
  Filled 2021-05-07: qty 50

## 2021-05-13 ENCOUNTER — Telehealth: Payer: Self-pay | Admitting: Oncology

## 2021-05-13 NOTE — Telephone Encounter (Signed)
Called patient regarding upcoming appointment, left a voicemail. 

## 2021-05-14 ENCOUNTER — Other Ambulatory Visit: Payer: Self-pay

## 2021-05-14 ENCOUNTER — Inpatient Hospital Stay: Payer: 59 | Admitting: Oncology

## 2021-05-14 VITALS — BP 150/69 | HR 63 | Temp 97.2°F | Resp 16 | Wt 138.5 lb

## 2021-05-14 DIAGNOSIS — C439 Malignant melanoma of skin, unspecified: Secondary | ICD-10-CM | POA: Diagnosis not present

## 2021-05-14 DIAGNOSIS — R911 Solitary pulmonary nodule: Secondary | ICD-10-CM | POA: Insufficient documentation

## 2021-05-14 DIAGNOSIS — Z79899 Other long term (current) drug therapy: Secondary | ICD-10-CM | POA: Diagnosis not present

## 2021-05-14 DIAGNOSIS — C775 Secondary and unspecified malignant neoplasm of intrapelvic lymph nodes: Secondary | ICD-10-CM | POA: Insufficient documentation

## 2021-05-14 DIAGNOSIS — I700 Atherosclerosis of aorta: Secondary | ICD-10-CM | POA: Insufficient documentation

## 2021-05-14 DIAGNOSIS — K449 Diaphragmatic hernia without obstruction or gangrene: Secondary | ICD-10-CM | POA: Insufficient documentation

## 2021-05-14 DIAGNOSIS — C4371 Malignant melanoma of right lower limb, including hip: Secondary | ICD-10-CM | POA: Insufficient documentation

## 2021-05-14 DIAGNOSIS — M7989 Other specified soft tissue disorders: Secondary | ICD-10-CM | POA: Diagnosis not present

## 2021-05-14 NOTE — Progress Notes (Signed)
Hematology and Oncology Follow Up Visit ? ?Chauncey Fischer ?[id number] ?Feb 05, 1940 81 y.o. ?05/14/2021 9:34 AM ?[name], MDSmith, [name], MD  ? ? ? ? ?Principle Diagnosis: 81 year old woman with stage III melanoma of the right foot with right inguinal involvement noted in 2018.  ?  ?Prior Therapy: ?In July 2016 she underwent excision of a right heel melanoma done by Dr. Marla Roe and the pathology showed malignant melanoma superficial subtype associated with melanoma in situ.  He had T2N0 disease at the time. ?  ?She developed a right inguinal mass and underwent a biopsy on 02/13/2016 confirmed the presence of metastatic melanoma. ?  ?Current therapy: Active surveillance.  ? ? ?Interim History: Ms. Librizzi is here for a follow-up visit.  Since the last visit, she reports feeling well without any major complaints.  She denies any recent hospitalizations or illnesses.  She denies any bone pain or pathological fractures.  She denies any lymphadenopathy or skin rash.  Her performance status quality of life remain excellent she continues to work close to full-time.  She denies any skin rashes or lesions.  She still has residual right lower extremity swelling which has not changed at this time. ? ? ? ?Medications: Updated on review. ?Current Outpatient Medications  ?Medication Sig Dispense Refill  ? diphenhydrAMINE (BENADRYL) 25 MG tablet Take one tablet every 4 hours for lip swelling. (Patient not taking: Reported on 05/08/2020)    ? HYDROcodone-acetaminophen (NORCO) 5-325 MG tablet Take 1-2 tablets by mouth every 4 (four) hours as needed for moderate pain or severe pain. 20 tablet 0  ? pantoprazole (PROTONIX) 40 MG tablet Take 40 mg by mouth daily.    ? ?No current facility-administered medications for this visit.  ? ? ? ?Allergies:  ?Allergies  ?Allergen Reactions  ? Sulfa Antibiotics Other (See Comments)  ?  Patient stated,"I haven't had since childhood and I don't know what kind of reaction I have."  ? Sulfonamide  Derivatives Other (See Comments)  ?  Patient stated,"I haven't had since childhood and I don't know what kind of reaction I have."  ? ? ? ?Physical exam: ? ? ?Blood pressure (!) 150/69, pulse 63, temperature (!) 97.2 ?F (36.2 ?C), temperature source Temporal, resp. rate 16, weight 138 lb 8 oz (62.8 kg), SpO2 100 %. ? ? ?ECOG 1 ? ? ?General appearance: Comfortable appearing without any discomfort ?Head: Normocephalic without any trauma ?Oropharynx: Mucous membranes are moist and pink without any thrush or ulcers. ?Eyes: Pupils are equal and round reactive to light. ?Lymph nodes: No cervical, supraclavicular, inguinal or axillary lymphadenopathy.   ?Heart:regular rate and rhythm.  S1 and S2.  1+ edema noted in her ankle up to the level of shin on the right. ?Lung: Clear without any rhonchi or wheezes.  No dullness to percussion. ?Abdomin: Soft, nontender, nondistended with good bowel sounds.  No hepatosplenomegaly. ?Musculoskeletal: No joint deformity or effusion.  Full range of motion noted. ?Neurological: No deficits noted on motor, sensory and deep tendon reflex exam. ?Skin: No petechial rash or dryness.  Appeared moist.  ? ? ? ? ? ? ? ?Lab Results: ?Lab Results  ?Component Value Date  ? WBC 5.9 05/07/2021  ? HGB 13.6 05/07/2021  ? HCT 41.5 05/07/2021  ? MCV 94.3 05/07/2021  ? PLT 217 05/07/2021  ? ?  Chemistry   ?   ?Component Value Date/Time  ? NA 141 05/07/2021 0955  ? NA 141 11/17/2016 0951  ? K 4.1 05/07/2021 0955  ? K 4.2 11/17/2016 0951  ?  CL 104 05/07/2021 0955  ? CO2 30 05/07/2021 0955  ? CO2 25 11/17/2016 0951  ? BUN 14 05/07/2021 0955  ? BUN 13.9 11/17/2016 0951  ? CREATININE 0.86 05/07/2021 0955  ? CREATININE 0.8 11/17/2016 0951  ?    ?Component Value Date/Time  ? CALCIUM 9.5 05/07/2021 0955  ? CALCIUM 9.4 11/17/2016 0951  ? ALKPHOS 76 05/07/2021 0955  ? ALKPHOS 72 11/17/2016 0951  ? AST 20 05/07/2021 0955  ? AST 17 11/17/2016 0951  ? ALT 17 05/07/2021 0955  ? ALT 12 11/17/2016 0951  ? BILITOT 0.5  05/07/2021 0955  ? BILITOT 0.31 11/17/2016 0951  ?  ? ? ?IMPRESSION: ?1. No evidence of metastatic disease within the chest, abdomen, or ?pelvis. ?2. Stable 7 mm ground-glass nodule in the anterior left upper lobe, ?unchanged dating back to at least November 03, 2017. ?3. Small hiatal hernia. ?4. Moderate volume of formed stool in the proximal colon. Correlate ?for constipation. ?5.  Aortic Atherosclerosis (ICD10-I70.0). ?  ? ? ? ?Impression and Plan: ? ?81 year old woman with: ?  ?1.    Stage III melanoma of the right foot with inguinal involvement confirmed in 2018.  She had initially diagnosed in 2016 with localized disease.  ? ? ?She is currently on active surveillance without any evidence of metastatic disease at this time.  CT scan obtained on May 07, 2021 was personally reviewed and discussed with the patient.  She has no evidence of metastatic disease at this time.  Laboratory data were also discussed and were completely normal.  At this time, I recommended continued annual surveillance with laboratory testing and defer any imaging studies unless she has specific symptoms. ? ? ? ?2. Follow-up:  In 12 months for repeat follow-up. ?  ? ?30  minutes were spent on this encounter.  The time was dedicated to reviewing laboratory data, imaging studies and outlining future plan of care. ? ? ?Zola Button, MD 05/14/2021 9:34 AM ?

## 2022-02-05 ENCOUNTER — Telehealth: Payer: Self-pay | Admitting: Oncology

## 2022-02-05 NOTE — Telephone Encounter (Signed)
Called patient per Dr. Alen Blew transition. Left voicemail for patient to call us back.

## 2022-02-14 ENCOUNTER — Telehealth: Payer: Self-pay | Admitting: Oncology

## 2022-02-14 NOTE — Telephone Encounter (Signed)
Returned patient's phone call regarding providers departure, patient is notified. Patient will be rescheduled with new provider.

## 2022-05-15 ENCOUNTER — Ambulatory Visit: Payer: 59 | Admitting: Oncology

## 2022-05-15 ENCOUNTER — Other Ambulatory Visit: Payer: 59

## 2022-05-15 ENCOUNTER — Ambulatory Visit: Payer: 59 | Admitting: Hematology & Oncology

## 2022-05-20 ENCOUNTER — Inpatient Hospital Stay (HOSPITAL_BASED_OUTPATIENT_CLINIC_OR_DEPARTMENT_OTHER): Payer: 59 | Admitting: Hematology & Oncology

## 2022-05-20 ENCOUNTER — Inpatient Hospital Stay: Payer: 59 | Attending: Hematology & Oncology

## 2022-05-20 ENCOUNTER — Encounter: Payer: Self-pay | Admitting: Hematology & Oncology

## 2022-05-20 VITALS — BP 174/79 | HR 59 | Temp 98.0°F | Resp 20 | Ht 63.0 in | Wt 144.0 lb

## 2022-05-20 DIAGNOSIS — C439 Malignant melanoma of skin, unspecified: Secondary | ICD-10-CM

## 2022-05-20 DIAGNOSIS — C4371 Malignant melanoma of right lower limb, including hip: Secondary | ICD-10-CM | POA: Insufficient documentation

## 2022-05-20 DIAGNOSIS — Z85820 Personal history of malignant melanoma of skin: Secondary | ICD-10-CM | POA: Diagnosis present

## 2022-05-20 HISTORY — DX: Malignant melanoma of right lower limb, including hip: C43.71

## 2022-05-20 LAB — CMP (CANCER CENTER ONLY)
ALT: 17 U/L (ref 0–44)
AST: 18 U/L (ref 15–41)
Albumin: 4.3 g/dL (ref 3.5–5.0)
Alkaline Phosphatase: 66 U/L (ref 38–126)
Anion gap: 9 (ref 5–15)
BUN: 20 mg/dL (ref 8–23)
CO2: 31 mmol/L (ref 22–32)
Calcium: 9.6 mg/dL (ref 8.9–10.3)
Chloride: 104 mmol/L (ref 98–111)
Creatinine: 0.96 mg/dL (ref 0.44–1.00)
GFR, Estimated: 60 mL/min — ABNORMAL LOW (ref >60)
Glucose, Bld: 120 mg/dL — ABNORMAL HIGH (ref 70–99)
Potassium: 5.9 mmol/L — ABNORMAL HIGH (ref 3.5–5.1)
Sodium: 144 mmol/L (ref 135–145)
Total Bilirubin: 0.5 mg/dL (ref 0.3–1.2)
Total Protein: 6.4 g/dL — ABNORMAL LOW (ref 6.5–8.1)

## 2022-05-20 LAB — CBC WITH DIFFERENTIAL (CANCER CENTER ONLY)
Abs Immature Granulocytes: 0.02 10*3/uL (ref 0.00–0.07)
Basophils Absolute: 0 10*3/uL (ref 0.0–0.1)
Basophils Relative: 0 %
Eosinophils Absolute: 0.2 10*3/uL (ref 0.0–0.5)
Eosinophils Relative: 2 %
HCT: 42.7 % (ref 36.0–46.0)
Hemoglobin: 14.3 g/dL (ref 12.0–15.0)
Immature Granulocytes: 0 %
Lymphocytes Relative: 24 %
Lymphs Abs: 1.8 10*3/uL (ref 0.7–4.0)
MCH: 31.4 pg (ref 26.0–34.0)
MCHC: 33.5 g/dL (ref 30.0–36.0)
MCV: 93.6 fL (ref 80.0–100.0)
Monocytes Absolute: 0.5 10*3/uL (ref 0.1–1.0)
Monocytes Relative: 6 %
Neutro Abs: 4.9 10*3/uL (ref 1.7–7.7)
Neutrophils Relative %: 68 %
Platelet Count: 196 10*3/uL (ref 150–400)
RBC: 4.56 MIL/uL (ref 3.87–5.11)
RDW: 14.4 % (ref 11.5–15.5)
WBC Count: 7.3 10*3/uL (ref 4.0–10.5)
nRBC: 0 % (ref 0.0–0.2)

## 2022-05-20 NOTE — Progress Notes (Signed)
Hematology and Oncology Follow Up Visit  Michaela Jefferson [id number] Jun 17, 1940 82 y.o. 05/20/2022   Principle Diagnosis:  Stage III (T3N1M0) superficial spreading melanoma of the right foot-2018  Current Therapy:   Observation     Interim History:  [name] is in for first office visit.  She has been followed by Dr. Clelia Croft.  Since seeing has left the practice, he calmly referred her to the [facility].  She is incredibly delightful.  She still works.  She works at [employer].  I cannot wait to see her there.  Patient has interesting history.  Back in July 2016, she had a melanoma of the right heel.  This was removed by Dr. Antony Madura him.  She had a superficial spreading melanoma.  At the time, this was a stage II (T2 N0 M0).  In 2018, she had recurrence.  This was in the right inguinal node.  She declined any additional therapy.  Since then, she has been followed.  She has had no issues since then.  She has had no problems with cough or shortness of breath.  She has had no nausea or vomiting.  She has had no change in bowel or bladder habits.  She is up-to-date with her mammograms.  She is up-to-date with her colonoscopy.  I think she says she will will have a colonoscopy this Summer.  She has had no headache.  She has had no rashes.  She had a little bit of leg swelling with the right leg secondary to the right inguinal lymphadenectomy.  She has had no problems with COVID.  She has had no visual changes.  Overall, I would have said that her performance status is probably ECOG 1.  Medications:  Current Outpatient Medications:    cholecalciferol (VITAMIN D3) 25 MCG (1000 UNIT) tablet, Take 3,000 Units by mouth daily., Disp: , Rfl:    dorzolamide-timolol (COSOPT) 2-0.5 % ophthalmic solution, Place 1 drop into both eyes 2 (two) times daily., Disp: , Rfl:    KLS OMPERAZOLE 20 MG TBEC, Take 1 tablet by mouth daily as needed., Disp: , Rfl:    Omega-3 Fatty Acids (FISH OIL) 300  MG CAPS, Take by mouth., Disp: , Rfl:    OPZELURA 1.5 % CREA, Apply topically daily., Disp: , Rfl:    pantoprazole (PROTONIX) 40 MG tablet, Take 40 mg by mouth daily as needed., Disp: , Rfl:    Turmeric 400 MG CAPS, Take 400 mg by mouth daily., Disp: , Rfl:    Vitamin E (VITAMIN E/D-ALPHA NATURAL) 268 MG (400 UNIT) CAPS, Take by mouth., Disp: , Rfl:    HYDROcodone-acetaminophen (NORCO) 7.5-325 MG tablet, Take 1 tablet by mouth 4 (four) times daily as needed. (Patient not taking: Reported on 05/20/2022), Disp: , Rfl:   Allergies:  Allergies  Allergen Reactions   Sulfa Antibiotics Other (See Comments)    Patient stated,"I haven't had since childhood and I don't know what kind of reaction I have."   Sulfonamide Derivatives Other (See Comments)    Patient stated,"I haven't had since childhood and I don't know what kind of reaction I have."    Past Medical History, Surgical history, Social history, and Family History were reviewed and updated.  Review of Systems: Review of Systems  Constitutional: Negative.   HENT:  Negative.    Eyes: Negative.   Respiratory: Negative.    Cardiovascular: Negative.   Gastrointestinal: Negative.   Endocrine: Negative.   Genitourinary: Negative.    Skin: Negative.  Neurological: Negative.   Hematological: Negative.   Psychiatric/Behavioral: Negative.      Physical Exam:  height is 5\' 3"  (1.6 m) and weight is 144 lb (65.3 kg). Her oral temperature is 98 F (36.7 C). Her blood pressure is 174/79 (abnormal) and her pulse is 59 (abnormal). Her respiration is 20 and oxygen saturation is 100%.   Wt Readings from Last 3 Encounters:  05/20/22 144 lb (65.3 kg)  05/14/21 138 lb 8 oz (62.8 kg)  05/08/20 137 lb 4.8 oz (62.3 kg)    Physical Exam Vitals reviewed.  HENT:     Head: Normocephalic and atraumatic.  Eyes:     Pupils: Pupils are equal, round, and reactive to light.  Cardiovascular:     Rate and Rhythm: Normal rate and regular rhythm.     Heart  sounds: Normal heart sounds.  Pulmonary:     Effort: Pulmonary effort is normal.     Breath sounds: Normal breath sounds.  Abdominal:     General: Bowel sounds are normal.     Palpations: Abdomen is soft.  Musculoskeletal:        General: No tenderness or deformity. Normal range of motion.     Cervical back: Normal range of motion.  Lymphadenopathy:     Cervical: No cervical adenopathy.  Skin:    General: Skin is warm and dry.     Findings: No erythema or rash.  Neurological:     Mental Status: She is alert and oriented to person, place, and time.  Psychiatric:        Behavior: Behavior normal.        Thought Content: Thought content normal.        Judgment: Judgment normal.      Lab Results  Component Value Date   WBC 7.3 05/20/2022   HGB 14.3 05/20/2022   HCT 42.7 05/20/2022   MCV 93.6 05/20/2022   PLT 196 05/20/2022     Chemistry      Component Value Date/Time   NA 144 05/20/2022 1032   NA 141 11/17/2016 0951   K 5.9 (H) 05/20/2022 1032   K 4.2 11/17/2016 0951   CL 104 05/20/2022 1032   CO2 31 05/20/2022 1032   CO2 25 11/17/2016 0951   BUN 20 05/20/2022 1032   BUN 13.9 11/17/2016 0951   CREATININE 0.96 05/20/2022 1032   CREATININE 0.8 11/17/2016 0951      Component Value Date/Time   CALCIUM 9.6 05/20/2022 1032   CALCIUM 9.4 11/17/2016 0951   ALKPHOS 66 05/20/2022 1032   ALKPHOS 72 11/17/2016 0951   AST 18 05/20/2022 1032   AST 17 11/17/2016 0951   ALT 17 05/20/2022 1032   ALT 12 11/17/2016 0951   BILITOT 0.5 05/20/2022 1032   BILITOT 0.31 11/17/2016 0951       Impression and Plan: Michaela Jefferson is a very charming 82 year old African-American female.  She has a very interesting history.  I am absolutely surprised that her melanoma has not recurred.  She does have an incredible strong faith.  I did give her a prayer blanket.  She was thankful for this.  I do not see that we have to do any scans on her.  She is asymptomatic.  Her lab work looks okay.  Her  calcium is on the high side.  I told her that she has to stop taking potassium with her vitamins.  We will continue to follow her yearly.  I think this is very reasonable.  I am just happy that she is able to work.  She really enjoys work.  She will come back in 1 week so we can repeat her potassium.   Josph Macho, MD 4/30/202412:20 PM

## 2022-05-27 ENCOUNTER — Other Ambulatory Visit: Payer: Self-pay

## 2022-05-27 ENCOUNTER — Inpatient Hospital Stay: Payer: 59 | Attending: Hematology & Oncology

## 2022-05-27 DIAGNOSIS — Z85820 Personal history of malignant melanoma of skin: Secondary | ICD-10-CM | POA: Diagnosis present

## 2022-05-27 DIAGNOSIS — C439 Malignant melanoma of skin, unspecified: Secondary | ICD-10-CM

## 2022-05-27 LAB — CBC WITH DIFFERENTIAL (CANCER CENTER ONLY)
Abs Immature Granulocytes: 0.02 10*3/uL (ref 0.00–0.07)
Basophils Absolute: 0 10*3/uL (ref 0.0–0.1)
Basophils Relative: 0 %
Eosinophils Absolute: 0.1 10*3/uL (ref 0.0–0.5)
Eosinophils Relative: 2 %
HCT: 40.6 % (ref 36.0–46.0)
Hemoglobin: 13.4 g/dL (ref 12.0–15.0)
Immature Granulocytes: 0 %
Lymphocytes Relative: 27 %
Lymphs Abs: 1.7 10*3/uL (ref 0.7–4.0)
MCH: 30.7 pg (ref 26.0–34.0)
MCHC: 33 g/dL (ref 30.0–36.0)
MCV: 93.1 fL (ref 80.0–100.0)
Monocytes Absolute: 0.5 10*3/uL (ref 0.1–1.0)
Monocytes Relative: 8 %
Neutro Abs: 4 10*3/uL (ref 1.7–7.7)
Neutrophils Relative %: 63 %
Platelet Count: 190 10*3/uL (ref 150–400)
RBC: 4.36 MIL/uL (ref 3.87–5.11)
RDW: 14.2 % (ref 11.5–15.5)
WBC Count: 6.4 10*3/uL (ref 4.0–10.5)
nRBC: 0 % (ref 0.0–0.2)

## 2022-05-27 LAB — CMP (CANCER CENTER ONLY)
ALT: 18 U/L (ref 0–44)
AST: 21 U/L (ref 15–41)
Albumin: 4.1 g/dL (ref 3.5–5.0)
Alkaline Phosphatase: 61 U/L (ref 38–126)
Anion gap: 9 (ref 5–15)
BUN: 22 mg/dL (ref 8–23)
CO2: 26 mmol/L (ref 22–32)
Calcium: 9.5 mg/dL (ref 8.9–10.3)
Chloride: 106 mmol/L (ref 98–111)
Creatinine: 0.94 mg/dL (ref 0.44–1.00)
GFR, Estimated: >60 mL/min (ref >60)
Glucose, Bld: 96 mg/dL (ref 70–99)
Potassium: 4.8 mmol/L (ref 3.5–5.1)
Sodium: 141 mmol/L (ref 135–145)
Total Bilirubin: 0.4 mg/dL (ref 0.3–1.2)
Total Protein: 7 g/dL (ref 6.5–8.1)

## 2022-05-28 ENCOUNTER — Telehealth: Payer: Self-pay | Admitting: *Deleted

## 2022-05-28 NOTE — Telephone Encounter (Signed)
-----   Message from Josph Macho, MD sent at 05/27/2022  8:43 PM EDT ----- Call - the K+ is perfect.  It is now 4.8.   pete

## 2022-05-28 NOTE — Telephone Encounter (Signed)
Called pt and gave lab results. Pt verbalized understanding. No concerns at this time.

## 2023-03-17 IMAGING — CT CT CHEST W/ CM
2 of 5 series · 13 of 36 positions shown, 16 images · IV contrast (OMNIPAQUE)
Comparison: Multiple priors including CT chest abdomen pelvis [place]

CLINICAL DATA: History of metastatic melanoma of the right lower
extremity with metastatic nodal disease in the right groin.
Restaging.

EXAM:
CT CHEST, ABDOMEN, AND PELVIS WITH CONTRAST
TECHNIQUE: Multidetector CT imaging of the chest, abdomen and pelvis was
performed following the standard protocol during bolus
administration of intravenous contrast.
CONTRAST:  100mL OMNIPAQUE IOHEXOL 300 MG/ML  SOLN

[Series 2: cap with · axial · 0.78mm/px · z∈[+76,+556]mm · 10 of 118 slices shown, 13 images]
[im 11/118  mediastinal]
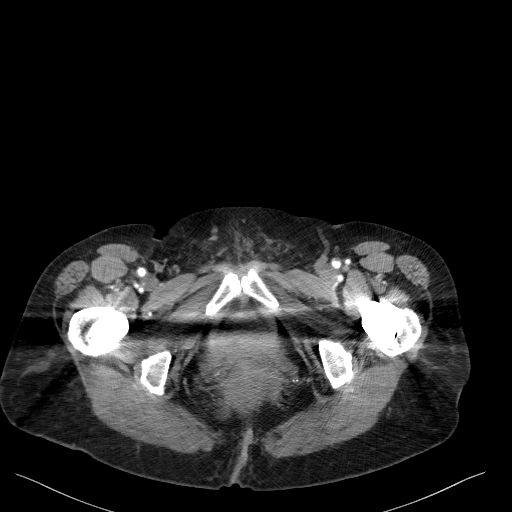
[im 11/118  lung]
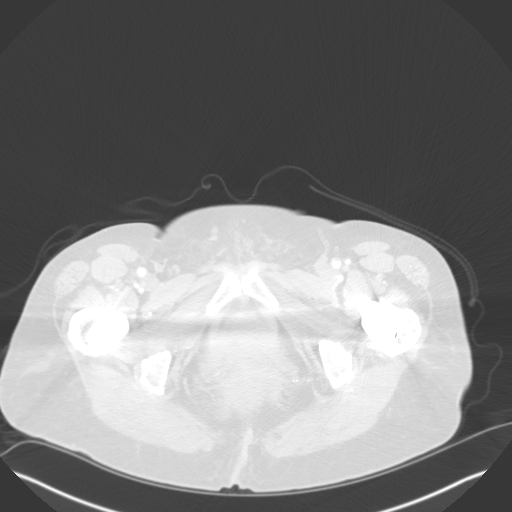
[im 22/118  lung]
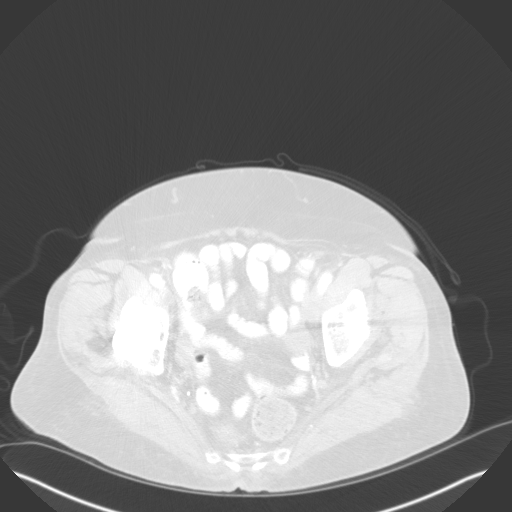
[im 32/118  lung]
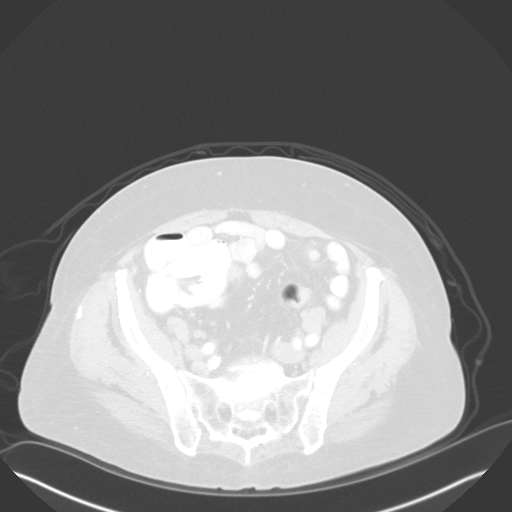
[im 43/118  lung]
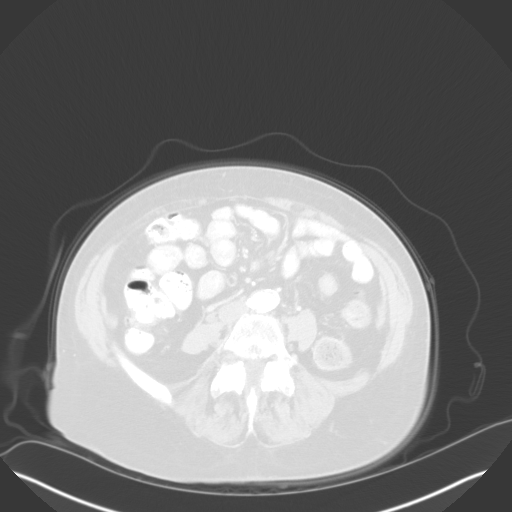
[im 54/118  mediastinal]
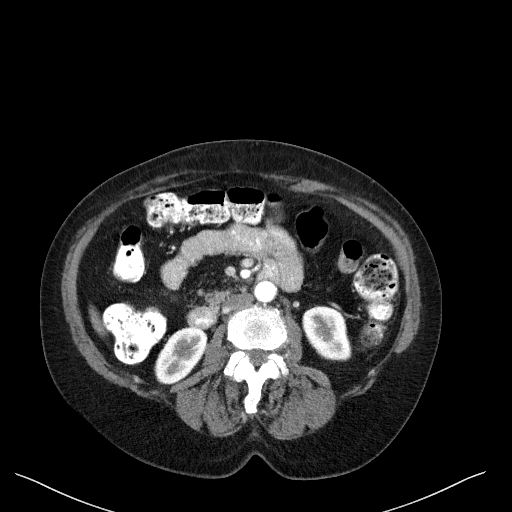
[im 54/118  lung]
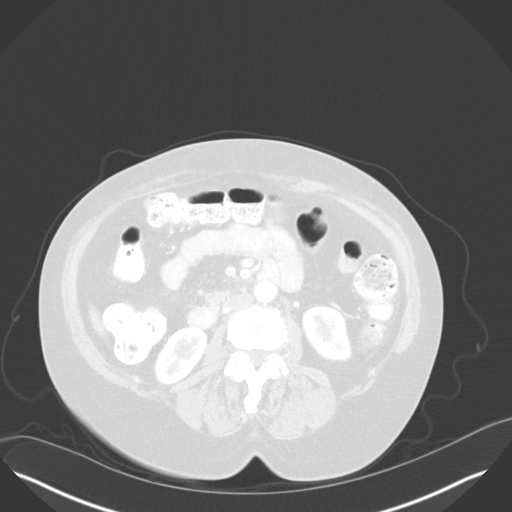
[im 64/118  lung]
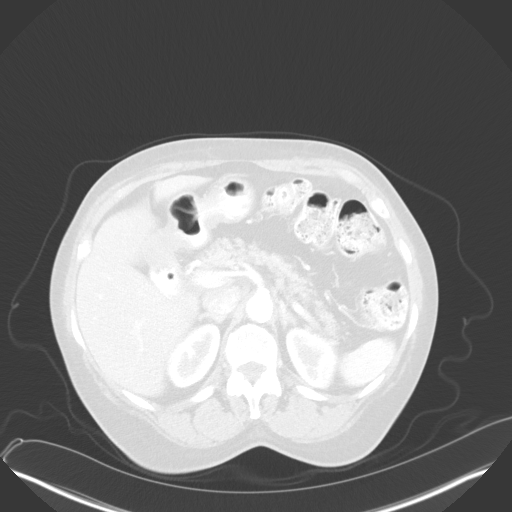
[im 75/118  lung]
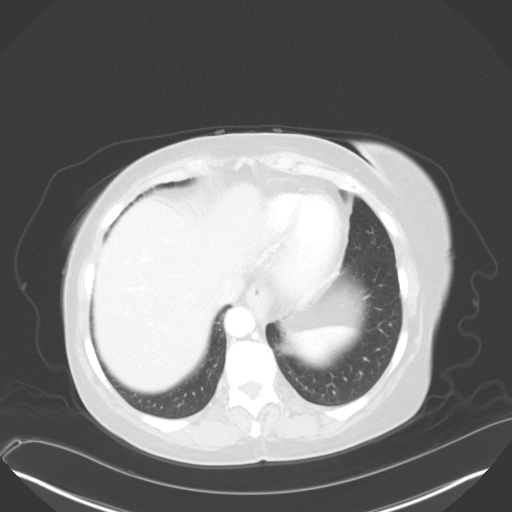
[im 86/118  lung]
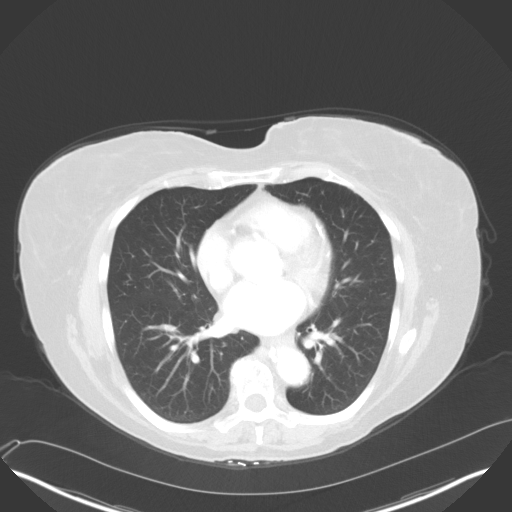
[im 96/118  mediastinal]
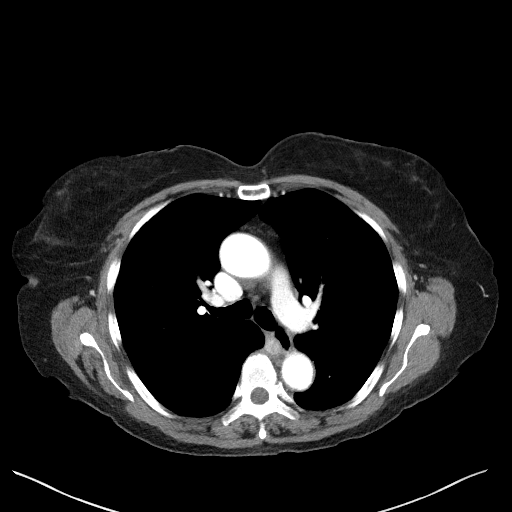
[im 96/118  lung]
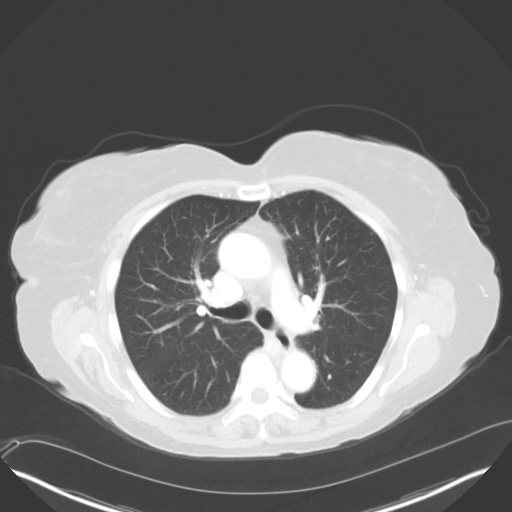
[im 107/118  lung]
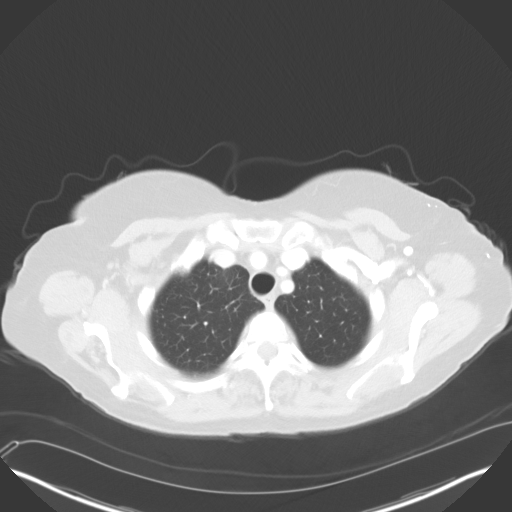

[Series 4: coronals · coronal · 0.82mm/px · 3 of 130 slices shown]
[im 26/130  lung]
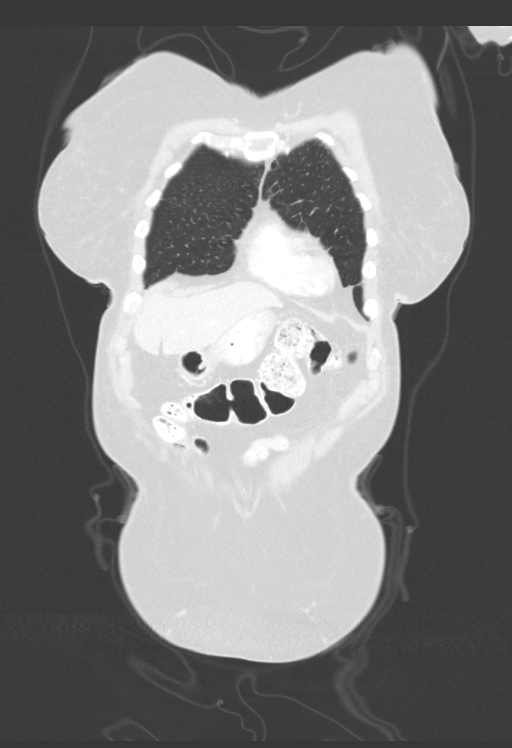
[im 52/130  lung]
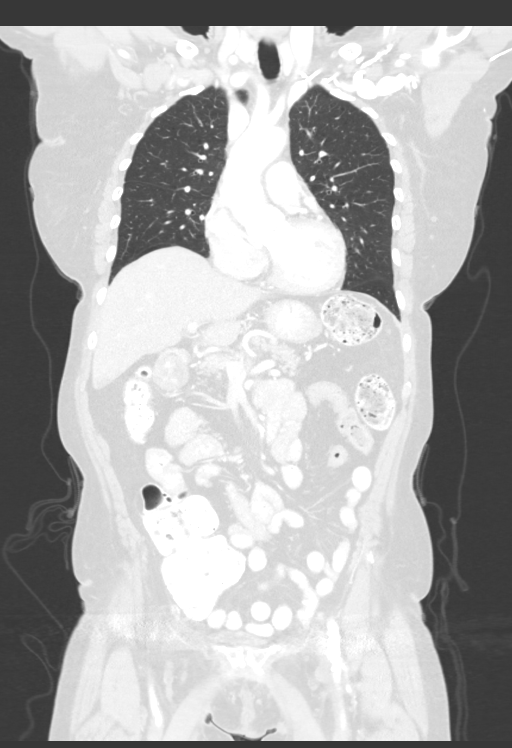
[im 78/130  lung]
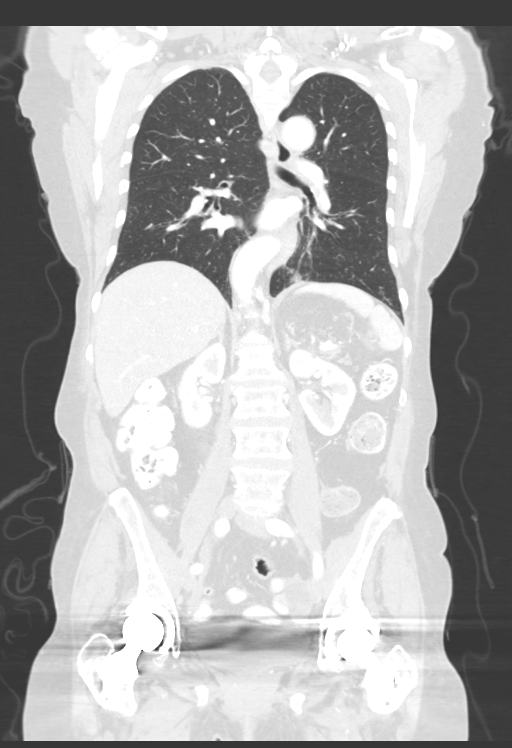

[13 of 36 positions shown; findings below may reference images not displayed]

FINDINGS: CT CHEST FINDINGS

Cardiovascular: Aortic atherosclerosis. No thoracic aortic aneurysm.
No central pulmonary embolus. Three-vessel coronary artery
calcifications. Normal size heart. No significant pericardial
effusion/thickening.

Mediastinum/Nodes: No mediastinal, hilar or axillary adenopathy. The
trachea, esophagus and thyroid are within normal limits. Small
hiatal hernia.

Lungs/Pleura: Stable 7 mm subtle ground-glass attenuation left upper
lobe on image 50/6. No new or enlarging suspicious pulmonary
nodules. No airspace consolidation. No pleural effusion. No
pneumothorax.

Musculoskeletal: No aggressive lytic or blastic lesion of bone.

CT ABDOMEN PELVIS FINDINGS

Hepatobiliary: Tiny calcified granuloma in the left lobe of the
liver. Hepatic steatosis. No suspicious hepatic lesion. Gallbladder
surgically absent. No biliary ductal dilation.

Pancreas: Unremarkable. No pancreatic ductal dilatation or
surrounding inflammatory changes.

Spleen: Normal in size without focal abnormality.

Adrenals/Urinary Tract: Adrenal glands are unremarkable. Kidneys are
without renal calculi, solid enhancing lesion, or hydronephrosis.
Hypodense 9 mm right interpolar renal cyst. Bladder and lower
ureters are obscured by beam hardening artifact.

Stomach/Bowel: Tiny hiatal hernia, otherwise the stomach is grossly
unremarkable. Radiopaque enteric contrast traverses the descending
colon. Normal positioning of the duodenum/ligament of Treitz. No
suspicious small bowel wall thickening or dilation. Appendix and
terminal ileum are grossly unremarkable. No suspicious colonic wall
thickening or mass like lesions.

Vascular/Lymphatic: Aortic atherosclerosis. No gastrohepatic or
hepatoduodenal ligament lymphadenopathy. No retroperitoneal or
mesenteric lymphadenopathy. No pelvic sidewall lymphadenopathy. No
groin lymphadenopathy. Portions of the lower pelvic sidewalls are
obscured by beam artifact from bilateral hip replacements.

Reproductive: Right ovary is unremarkable. Uterus is surgically
absent. Stable 2.3 cm left ovarian cyst. No follow-up imaging
recommended. Note: This recommendation does not apply to
premenarchal patients and to those with increased risk (genetic,
family history, elevated tumor markers or other high-risk factors)
of ovarian cancer. Reference: JACR [DATE]):[id number]

Other: No abdominopelvic ascites.  Right inguinal surgical clips.

Musculoskeletal: Bilateral hip replacements. No suspicious lytic or
blastic lesions of bone. Multilevel degenerative changes spine.
IMPRESSION: 1. No evidence of metastatic disease within the chest, abdomen, or
pelvis.
2. Stable 7 mm subtle ground-glass attenuation left upper lobe,
continued attention on follow-up imaging is recommended.
3. Aortic atherosclerosis.

Aortic Atherosclerosis ([id number]).

## 2023-05-19 ENCOUNTER — Inpatient Hospital Stay: Payer: 59 | Attending: Hematology & Oncology

## 2023-05-19 ENCOUNTER — Inpatient Hospital Stay: Payer: 59 | Admitting: Family

## 2023-05-19 ENCOUNTER — Encounter: Payer: Self-pay | Admitting: Family

## 2023-05-19 VITALS — BP 141/72 | HR 67 | Temp 98.2°F | Resp 17 | Wt 137.1 lb

## 2023-05-19 DIAGNOSIS — C4371 Malignant melanoma of right lower limb, including hip: Secondary | ICD-10-CM

## 2023-05-19 DIAGNOSIS — Z85820 Personal history of malignant melanoma of skin: Secondary | ICD-10-CM | POA: Diagnosis present

## 2023-05-19 DIAGNOSIS — C439 Malignant melanoma of skin, unspecified: Secondary | ICD-10-CM

## 2023-05-19 LAB — CMP (CANCER CENTER ONLY)
ALT: 11 U/L (ref 0–44)
AST: 17 U/L (ref 15–41)
Albumin: 3.5 g/dL (ref 3.5–5.0)
Alkaline Phosphatase: 60 U/L (ref 38–126)
Anion gap: 12 (ref 5–15)
BUN: 9 mg/dL (ref 8–23)
CO2: 28 mmol/L (ref 22–32)
Calcium: 9.4 mg/dL (ref 8.9–10.3)
Chloride: 103 mmol/L (ref 98–111)
Creatinine: 0.91 mg/dL (ref 0.44–1.00)
GFR, Estimated: >60 mL/min (ref >60)
Glucose, Bld: 81 mg/dL (ref 70–99)
Potassium: 4.8 mmol/L (ref 3.5–5.1)
Sodium: 143 mmol/L (ref 135–145)
Total Bilirubin: 0.5 mg/dL (ref 0.0–1.2)
Total Protein: 7 g/dL (ref 6.5–8.1)

## 2023-05-19 LAB — CBC WITH DIFFERENTIAL (CANCER CENTER ONLY)
Abs Immature Granulocytes: 0.02 10*3/uL (ref 0.00–0.07)
Basophils Absolute: 0 10*3/uL (ref 0.0–0.1)
Basophils Relative: 0 %
Eosinophils Absolute: 0.1 10*3/uL (ref 0.0–0.5)
Eosinophils Relative: 2 %
HCT: 42.1 % (ref 36.0–46.0)
Hemoglobin: 14.1 g/dL (ref 12.0–15.0)
Immature Granulocytes: 0 %
Lymphocytes Relative: 21 %
Lymphs Abs: 1.2 10*3/uL (ref 0.7–4.0)
MCH: 31.5 pg (ref 26.0–34.0)
MCHC: 33.5 g/dL (ref 30.0–36.0)
MCV: 94.2 fL (ref 80.0–100.0)
Monocytes Absolute: 0.5 10*3/uL (ref 0.1–1.0)
Monocytes Relative: 8 %
Neutro Abs: 4 10*3/uL (ref 1.7–7.7)
Neutrophils Relative %: 69 %
Platelet Count: 222 10*3/uL (ref 150–400)
RBC: 4.47 MIL/uL (ref 3.87–5.11)
RDW: 14.1 % (ref 11.5–15.5)
WBC Count: 5.8 10*3/uL (ref 4.0–10.5)
nRBC: 0 % (ref 0.0–0.2)

## 2023-05-19 LAB — LACTATE DEHYDROGENASE: LDH: 232 U/L — ABNORMAL HIGH (ref 98–192)

## 2023-05-19 NOTE — Progress Notes (Signed)
 Hematology and Oncology Follow Up Visit  Michaela Jefferson [id number] Apr 18, 1940 83 y.o. 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 She is staying busy with her sweet family and enjoying reading her Bible daily.  She is followed by dermatology twice a year and recently had a biopsy of a small spot on the right outer heel of the right foot. She states that the biopsy showed this to be a wart. Heel and wart appear unchanged from baseline. No redness or edema noted.  No other skin changes since her last visit.  She has minimal puffiness around the right ankle bone.  She states that she had a fall at work several years ago and injured her right knee. She has noted over the last few months that her gate is turned inward at the right knee which is effecting her mobility. She has an appointment with her orthopedist for further evaluation.  No recent falls or syncope.  No fever, chills, n/v, cough, rash, dizziness, SOB, chest pain, palpitations, abdominal pain or changes in bowel or bladder habits.  Appetite and hydration are good. Weight is stable at 137 lbs.   ECOG Performance Status: 1 - Symptomatic but completely ambulatory  Medications:  Allergies as of 05/19/2023       Reactions   Sulfa Antibiotics Other (See Comments)   Patient stated,"I haven't had since childhood and I don't know what kind of reaction I have."   Sulfonamide Derivatives Other (See Comments)   Patient stated,"I haven't had since childhood and I don't know what kind of reaction I have."        Medication List        Accurate as of May 19, 2023 12:19 PM. If you have any questions, ask your nurse or doctor.          acetaminophen  325 MG tablet Commonly known as: TYLENOL  Take 325 mg by mouth  every 6 (six) hours as needed.   Aloe Vera 25 MG Caps Take 1 capsule by mouth daily.   ascorbic acid 1000 MG tablet Commonly known as: VITAMIN C Take 1,000 mg by mouth daily.   Biotin 5000 MCG Caps Take 5 mg by mouth daily at 6 (six) AM.   cholecalciferol 25 MCG (1000 UNIT) tablet Commonly known as: VITAMIN D3 Take 3,000 Units by mouth daily.   Cinnamon 500 MG capsule Take 1 capsule by mouth daily.   Coenzyme Q10 100 MG capsule Take 100 mg by mouth daily.   dorzolamide-timolol 2-0.5 % ophthalmic solution Commonly known as: COSOPT Place 1 drop into both eyes 2 (two) times daily.   Fish Oil 300 MG Caps Take by mouth.   folic acid 1 MG tablet Commonly known as: FOLVITE Take 1 mg by mouth daily.   GARLIC PO Take 1 capsule by mouth daily.   GINKGO BILOBA EXTRACT PO Take 1 tablet by mouth daily.   GINSENG PO Take 1 capsule by mouth daily.   Glucosamine Sulfate 500 MG Tabs Take 500 mg by mouth daily at 6 (six) AM.   HYDROcodone -acetaminophen  7.5-325 MG tablet Commonly known as: NORCO Take 1 tablet by mouth 4 (four) times daily as needed.   KLS OMPERAZOLE 20 MG Tbec Generic drug: Omeprazole Take 1 tablet by mouth daily  as needed.   L-Lysine HCl 500 MG Tabs Take 500 mg by mouth daily at 6 (six) AM.   Lutein 20 MG Caps Take 1 capsule by mouth daily.   Magnesium Oxide -Mg Supplement 400 MG Caps Take 1 capsule by mouth daily.   niacin 100 MG tablet Commonly known as: VITAMIN B3 Take 100 mg by mouth at bedtime.   Opzelura 1.5 % Crea Generic drug: Ruxolitinib Phosphate Apply topically daily.   pantoprazole 40 MG tablet Commonly known as: PROTONIX Take 40 mg by mouth daily as needed.   Potassium Citrate(Elemental K) 99 MG Caps Take 1 capsule by mouth daily.   prednisoLONE acetate 1 % ophthalmic suspension Commonly known as: PRED FORTE Place 1 drop into the right eye 2 (two) times daily.   Red Yeast Rice Extract 600 MG Tabs Take 1 tablet by mouth  daily.   selenium 200 MCG Tabs tablet Take 200 mcg by mouth daily.   Turmeric 400 MG Caps Take 400 mg by mouth daily.   Vitamin B-12 1000 MCG Subl Take 1,000 mcg by mouth daily at 6 (six) AM.   Vitamin E/D-Alpha Natural 268 MG (400 UNIT) Caps Generic drug: Vitamin E Take by mouth.   zinc gluconate 50 MG tablet Take 50 mg by mouth daily.        Allergies:  Allergies  Allergen Reactions   Sulfa Antibiotics Other (See Comments)    Patient stated,"I haven't had since childhood and I don't know what kind of reaction I have."   Sulfonamide Derivatives Other (See Comments)    Patient stated,"I haven't had since childhood and I don't know what kind of reaction I have."    Past Medical History, Surgical history, Social history, and Family History were reviewed and updated.  Review of Systems: All other 10 point review of systems is negative.   Physical Exam:  weight is 137 lb 1.9 oz (62.2 kg). Her oral temperature is 98.2 F (36.8 C). Her blood pressure is 141/72 (abnormal) and her pulse is 67. Her respiration is 17 and oxygen saturation is 100%.   Wt Readings from Last 3 Encounters:  05/19/23 137 lb 1.9 oz (62.2 kg)  05/20/22 144 lb (65.3 kg)  05/14/21 138 lb 8 oz (62.8 kg)    Ocular: Sclerae unicteric, pupils equal, round and reactive to light Ear-nose-throat: Oropharynx clear, dentition fair Lymphatic: No cervical or supraclavicular adenopathy Lungs no rales or rhonchi, good excursion bilaterally Heart regular rate and rhythm, no murmur appreciated Abd soft, nontender, positive bowel sounds MSK no focal spinal tenderness, no joint edema Neuro: non-focal, well-oriented, appropriate affect Breasts: Deferred   Lab Results  Component Value Date   WBC 5.8 05/19/2023   HGB 14.1 05/19/2023   HCT 42.1 05/19/2023   MCV 94.2 05/19/2023   PLT 222 05/19/2023   No results found for: "FERRITIN", "IRON", "TIBC", "UIBC", "IRONPCTSAT" Lab Results  Component Value Date    RBC 4.47 05/19/2023   No results found for: "KPAFRELGTCHN", "LAMBDASER", "KAPLAMBRATIO" No results found for: "IGGSERUM", "IGA", "IGMSERUM" No results found for: "TOTALPROTELP", "ALBUMINELP", "A1GS", "A2GS", "BETS", "BETA2SER", "GAMS", "MSPIKE", "SPEI"   Chemistry      Component Value Date/Time   NA 141 05/27/2022 1451   NA 141 11/17/2016 0951   K 4.8 05/27/2022 1451   K 4.2 11/17/2016 0951   CL 106 05/27/2022 1451   CO2 26 05/27/2022 1451   CO2 25 11/17/2016 0951   BUN 22 05/27/2022 1451   BUN 13.9 11/17/2016 0951  CREATININE 0.94 05/27/2022 1451   CREATININE 0.8 11/17/2016 0951      Component Value Date/Time   CALCIUM 9.5 05/27/2022 1451   CALCIUM 9.4 11/17/2016 0951   ALKPHOS 61 05/27/2022 1451   ALKPHOS 72 11/17/2016 0951   AST 21 05/27/2022 1451   AST 17 11/17/2016 0951   ALT 18 05/27/2022 1451   ALT 12 11/17/2016 0951   BILITOT 0.4 05/27/2022 1451   BILITOT 0.31 11/17/2016 0951       Impression and Plan: Michaela Jefferson is a pleasant 83 yo African American female with history of recurrent melanoma of the right foot in 2018.  Since then she has done quite well and there has been no evidence of progression of disease.  She continues to follow-up with dermatology twice a year.  Exam today was negative.  We will plan to see her again in 1 year. As always she is a lot of fun to talk to.   Kennard Pea, NP 4/29/202512:19 PM

## 2024-03-29 IMAGING — CT CT CHEST-ABD-PELV W/ CM
3 of 5 series · 13 of 36 positions shown, 15 images · IV contrast (APPLIED)
Comparison: Multiple priors including most recent CT April 24, 2020

CLINICAL DATA: History of right lower extremity melanoma with nodal
disease in the right groin. Follow-up/restaging. * Tracking Code: BO
*

EXAM:
CT CHEST, ABDOMEN, AND PELVIS WITH CONTRAST
TECHNIQUE: Multidetector CT imaging of the chest, abdomen and pelvis was
performed following the standard protocol during bolus
administration of intravenous contrast.

[Series 2: cap with · axial · 0.66mm/px · z∈[-597,-132]mm · 9 of 117 slices shown, 11 images]
[im 12/117  mediastinal]
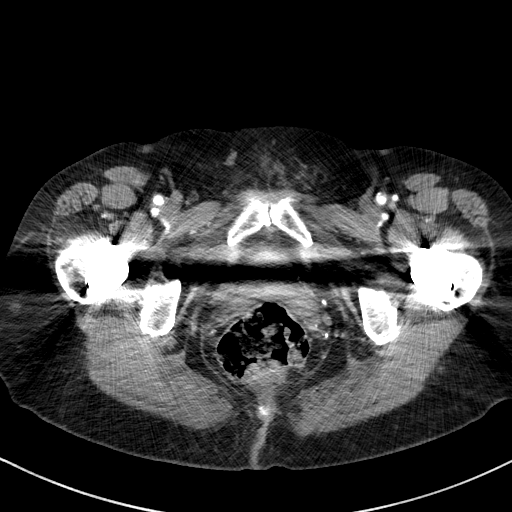
[im 12/117  bone]
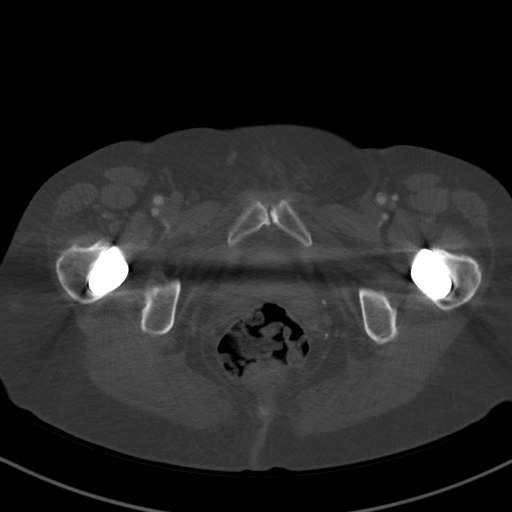
[im 24/117  mediastinal]
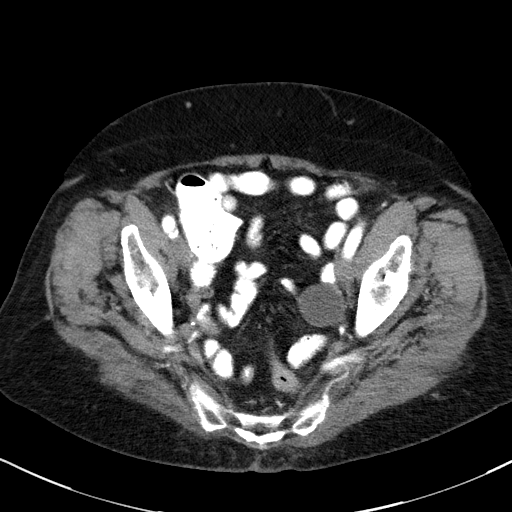
[im 35/117  mediastinal]
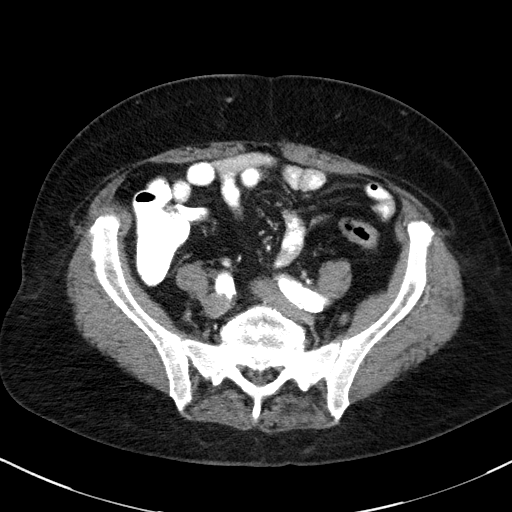
[im 47/117  mediastinal]
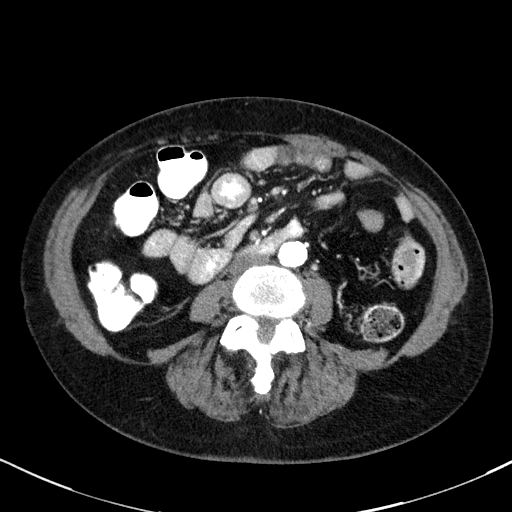
[im 59/117  mediastinal]
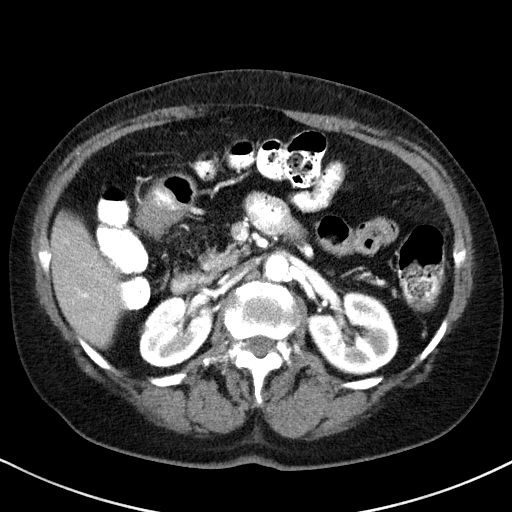
[im 70/117  mediastinal]
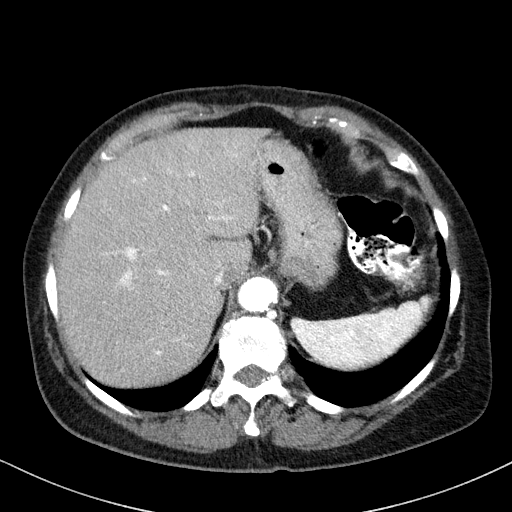
[im 82/117  mediastinal]
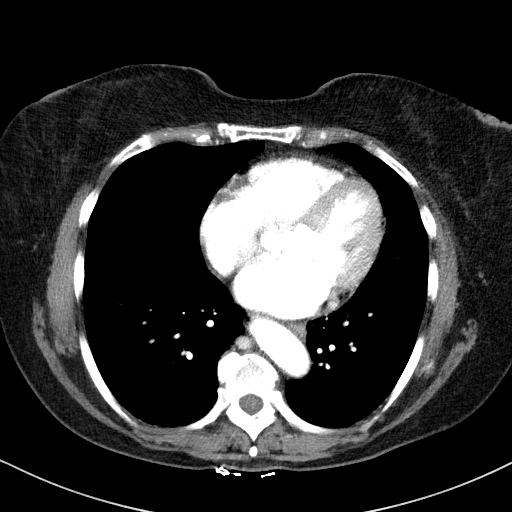
[im 93/117  mediastinal]
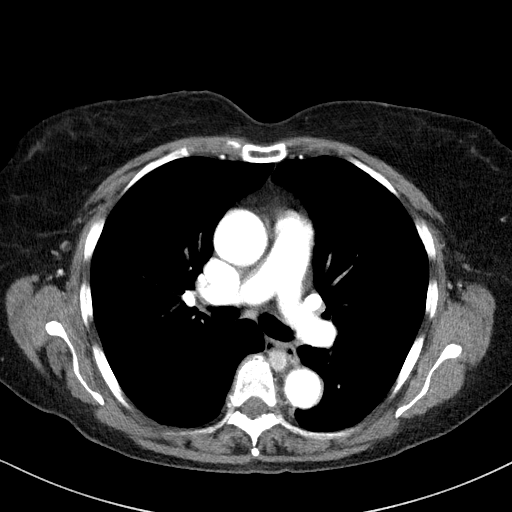
[im 105/117  mediastinal]
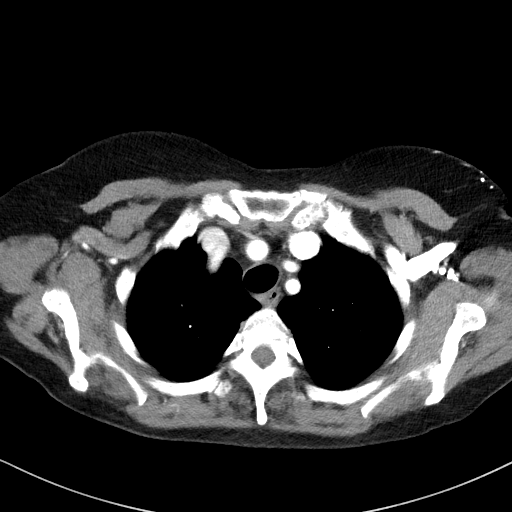
[im 105/117  bone]
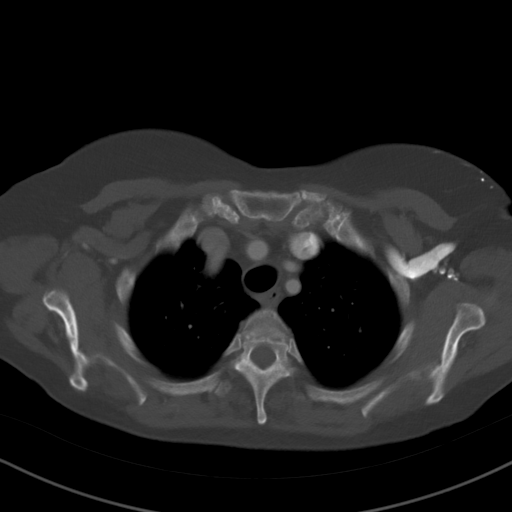

[Series 4: coronals · coronal · 0.74mm/px · 3 of 138 slices shown]
[im 28/138  mediastinal]
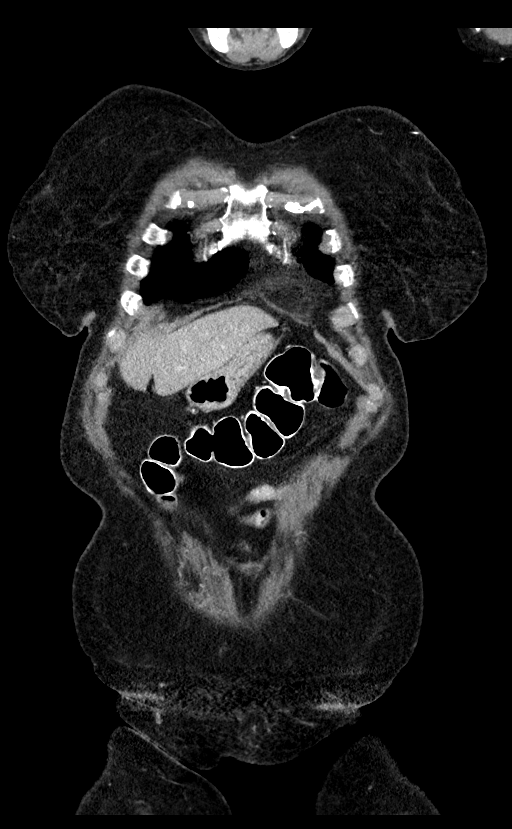
[im 55/138  mediastinal]
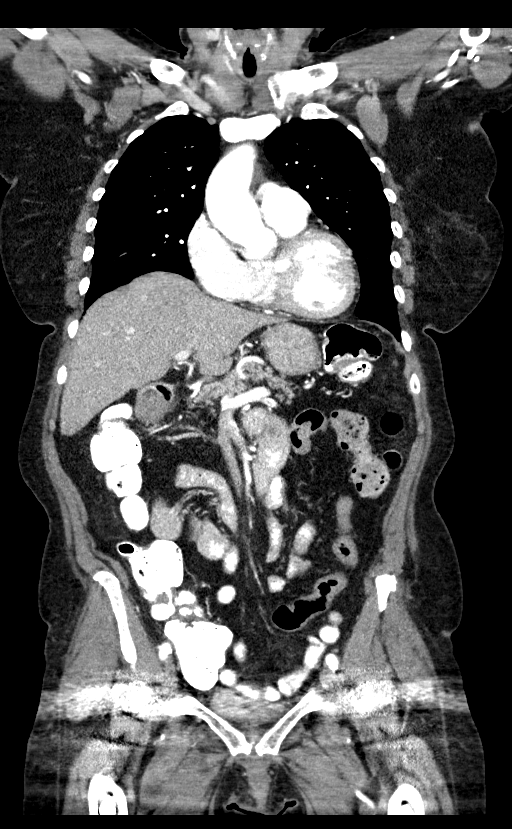
[im 83/138  mediastinal]
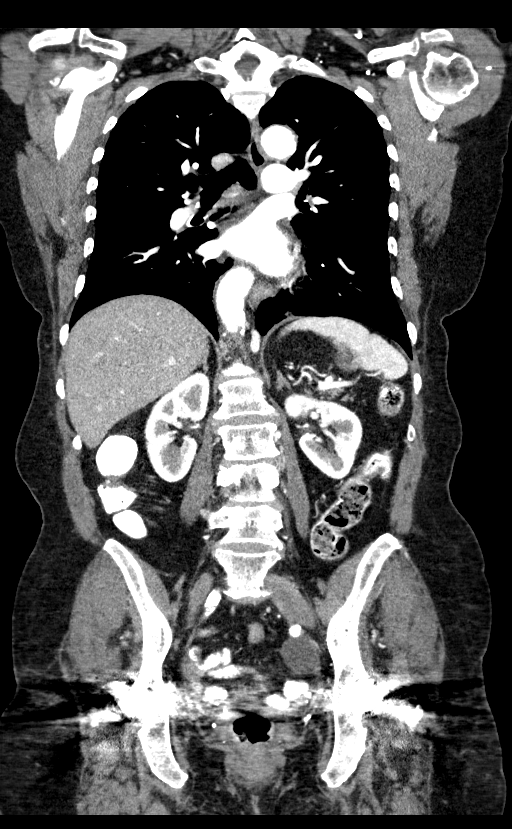

[Series 6: lung · axial · 0.66mm/px · 1 of 141 slices shown]
[im 11/141  bone]
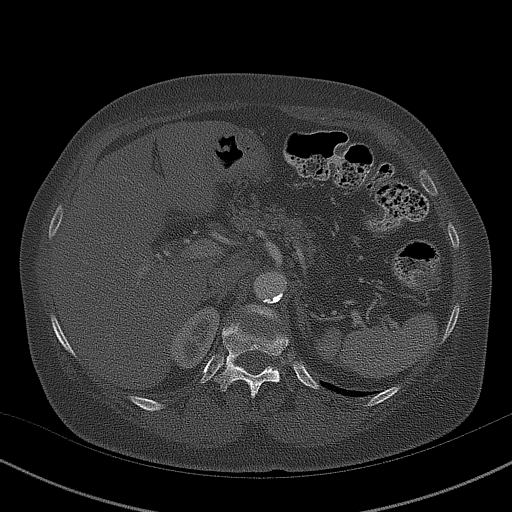

[13 of 36 positions shown; findings below may reference images not displayed]

RADIATION DOSE REDUCTION: This exam was performed according to the
departmental dose-optimization program which includes automated
exposure control, adjustment of the mA and/or kV according to
patient size and/or use of iterative reconstruction technique.

CONTRAST:  100mL OMNIPAQUE IOHEXOL 300 MG/ML  SOLN
FINDINGS: CT CHEST FINDINGS

Cardiovascular: Aortic atherosclerosis without aneurysmal dilation.
No central pulmonary embolus on this nondedicated study. Coronary
artery calcifications. Normal size heart. No significant pericardial
effusion/thickening.

Mediastinum/Nodes: No supraclavicular adenopathy. No discrete
thyroid nodule. No pathologically enlarged mediastinal, hilar or
axillary lymph nodes. The esophagus is grossly unremarkable. Small
hiatal hernia.

Lungs/Pleura: Stable 7 mm ground-glass nodule in the anterior left
upper lobe on image 55/6. No new or enlarging suspicious pulmonary
nodules or masses. No pleural effusion. No pneumothorax.

Musculoskeletal: No aggressive lytic or blastic lesion of bone.
Thoracic spondylosis.

CT ABDOMEN PELVIS FINDINGS

Hepatobiliary: Calcified granuloma in the left hepatic lobe. No
suspicious hepatic lesion. Gallbladder surgically absent. No biliary
ductal dilation.

Pancreas: No pancreatic ductal dilation or evidence of acute
inflammation.

Spleen: No splenomegaly or focal splenic lesion.

Adrenals/Urinary Tract: Bilateral adrenal glands appear normal. No
hydronephrosis. Kidneys demonstrate symmetric enhancement and
excretion of contrast material. Stable hypodense 8 mm right
interpolar renal cyst which in the absence of clinically indicated
signs/symptoms requires no independent follow-up. Evaluation of the
urinary bladder is limited by under distension and streak artifact
from bilateral hip arthroplasties.

Stomach/Bowel: Radiopaque enteric contrast material traverses the
splenic flexure. Small hiatal hernia. Stomach is nondistended
limiting evaluation. No pathologic dilation of small or large bowel.
The terminal ileum appears normal. Normal appendix. Moderate volume
of formed stool in the proximal colon. No suspicious colonic wall
thickening or mass like lesions identified.

Vascular/Lymphatic: Aortic and branch vessel atherosclerosis without
abdominal aortic aneurysm. No pathologically enlarged abdominal or
pelvic lymph nodes identified. Portions of the lower pelvic
sidewalls are obscured by beam hardening artifact from bilateral hip
replacements.

Reproductive: Simple appearing 2.6 cm left ovarian cyst requires no
follow-up. Right adnexa is unremarkable.

Other: Right inguinal surgical clips. No significant abdominopelvic
free fluid.

Musculoskeletal: Bilateral total hip arthroplasties. No aggressive
lytic or blastic lesion of bone. Multilevel degenerative changes
spine.
IMPRESSION: 1. No evidence of metastatic disease within the chest, abdomen, or
pelvis.
2. Stable 7 mm ground-glass nodule in the anterior left upper lobe,
unchanged dating back to at least November 03, 2017.
3. Small hiatal hernia.
4. Moderate volume of formed stool in the proximal colon. Correlate
for constipation.
5.  Aortic Atherosclerosis ([id number]).

## 2024-05-17 ENCOUNTER — Ambulatory Visit: Admitting: Hematology & Oncology

## 2024-05-17 ENCOUNTER — Inpatient Hospital Stay
